# Patient Record
Sex: Male | Born: 1985
Health system: Southern US, Community
[De-identification: ages and names within clinical notes are randomized; demographics above are authoritative.]

## PROBLEM LIST (undated history)

## (undated) DIAGNOSIS — Z87442 Personal history of urinary calculi: Secondary | ICD-10-CM

## (undated) HISTORY — DX: Personal history of urinary calculi: Z87.442

---
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
## 2006-06-25 ENCOUNTER — Emergency Department (HOSPITAL_COMMUNITY): Admission: EM | Admit: 2006-06-25 | Discharge: 2006-06-25 | Payer: Self-pay | Admitting: Family Medicine

## 2008-08-11 ENCOUNTER — Emergency Department (HOSPITAL_COMMUNITY): Admission: EM | Admit: 2008-08-11 | Discharge: 2008-08-11 | Payer: Self-pay | Admitting: Emergency Medicine

## 2008-09-04 ENCOUNTER — Ambulatory Visit (HOSPITAL_COMMUNITY): Admission: RE | Admit: 2008-09-04 | Discharge: 2008-09-04 | Payer: Self-pay | Admitting: Urology

## 2008-12-04 ENCOUNTER — Encounter: Payer: Self-pay | Admitting: Family Medicine

## 2008-12-04 LAB — CONVERTED CEMR LAB
ALT: 46 units/L
AST: 25 units/L
Albumin: 4.8 g/dL
Alkaline Phosphatase: 67 units/L
CO2: 22 meq/L
Calcium: 9.8 mg/dL
Chloride: 105 meq/L
Cholesterol: 127 mg/dL
Creatinine, Ser: 0.82 mg/dL
HCT: 46.6 %
HDL: 35 mg/dL
Hemoglobin: 15.8 g/dL
LDL Cholesterol: 61 mg/dL
MCV: 82.7 fL
Platelets: 108 10*3/uL
Potassium: 4.4 meq/L
RDW: 14.9 %
Sodium: 140 meq/L
Total Bilirubin: 0.7 mg/dL
Total Protein: 7.4 g/dL
Triglycerides: 157 mg/dL
VLDL: 31 mg/dL
WBC: 8.4 10*3/uL

## 2009-01-25 ENCOUNTER — Encounter: Payer: Self-pay | Admitting: Family Medicine

## 2009-01-25 ENCOUNTER — Ambulatory Visit: Payer: Self-pay | Admitting: Family Medicine

## 2009-01-25 DIAGNOSIS — M545 Low back pain, unspecified: Secondary | ICD-10-CM | POA: Insufficient documentation

## 2009-01-25 DIAGNOSIS — Z87442 Personal history of urinary calculi: Secondary | ICD-10-CM | POA: Insufficient documentation

## 2009-01-25 DIAGNOSIS — E669 Obesity, unspecified: Secondary | ICD-10-CM | POA: Insufficient documentation

## 2009-01-25 HISTORY — DX: Obesity, unspecified: E66.9

## 2009-01-25 HISTORY — DX: Personal history of urinary calculi: Z87.442

## 2009-01-29 ENCOUNTER — Encounter: Payer: Self-pay | Admitting: Family Medicine

## 2009-02-05 ENCOUNTER — Encounter: Payer: Self-pay | Admitting: Family Medicine

## 2009-02-06 ENCOUNTER — Ambulatory Visit: Payer: Self-pay | Admitting: Family Medicine

## 2009-02-06 ENCOUNTER — Telehealth (INDEPENDENT_AMBULATORY_CARE_PROVIDER_SITE_OTHER): Payer: Self-pay | Admitting: *Deleted

## 2009-02-06 DIAGNOSIS — M79609 Pain in unspecified limb: Secondary | ICD-10-CM | POA: Insufficient documentation

## 2009-06-11 ENCOUNTER — Telehealth: Payer: Self-pay | Admitting: Family Medicine

## 2009-06-11 ENCOUNTER — Ambulatory Visit: Payer: Self-pay | Admitting: Family Medicine

## 2009-06-11 DIAGNOSIS — H9209 Otalgia, unspecified ear: Secondary | ICD-10-CM | POA: Insufficient documentation

## 2009-06-11 DIAGNOSIS — J301 Allergic rhinitis due to pollen: Secondary | ICD-10-CM | POA: Insufficient documentation

## 2009-06-11 DIAGNOSIS — J029 Acute pharyngitis, unspecified: Secondary | ICD-10-CM | POA: Insufficient documentation

## 2009-06-11 HISTORY — DX: Allergic rhinitis due to pollen: J30.1

## 2009-06-11 LAB — CONVERTED CEMR LAB: Rapid Strep: POSITIVE

## 2010-04-15 ENCOUNTER — Ambulatory Visit
Admission: RE | Admit: 2010-04-15 | Discharge: 2010-04-15 | Payer: Self-pay | Source: Home / Self Care | Attending: Family Medicine | Admitting: Family Medicine

## 2010-04-16 NOTE — Miscellaneous (Signed)
Summary: ROI  ROI   Imported By: Clydell Hakim 01/25/2009 14:00:42  _____________________________________________________________________  External Attachment:    Type:   Image     Comment:   External Document

## 2010-04-16 NOTE — Assessment & Plan Note (Signed)
Summary: ear pain/[name]/carew   Vital Signs:  Patient profile:   25 year old male Weight:      238.5 pounds Temp:     98.2 degrees F oral Pulse rate:   61 / minute Pulse rhythm:   regular BP sitting:   124 / 81  (left arm) Cuff size:   large  Vitals Entered By: Loralee Pacas CMA (June 11, 2009 11:29 AM) Comments Steven Holt    History of Present Illness: 1. Steven ear pain Reports pain in the left ear starting this am. Hearing is okay. No dripping from the ear. Pain seems to "connect to the throat." Felt the pain with drinking coffee this am. Had the flu a few weeks ago and what might have been a sinus infection a few weeks ago. Has had runny nose on and off since the flu.  fevers:   no  chills: no    nausea: no    vomiting: no    diarrhea: no     chest pain: no    shortness of breath: no     Has had ear pain intermittently a few weeks ago (not sure which ear).   2. ? allergies Pt mentions at this time of year he has runny nose, stuffy head and cough. Would like something to help with this.   Current Medications (verified): 1)  None  Allergies (verified): 1)  ! Penicillin  Review of Systems       review of systems as noted in HPI section   Physical Exam  General:  Vital signs reviewed -- obese but otherwise normal Alert, appropriate; well-dressed and well-nourished  Ears:  Steven canal mildly irritated, TM normal; R TM and canal normal.  Nose:  clear without rhinorrhea; no deformity  Mouth:  tonsils 3+ bilaterally, mucosa mildly inflamed with mild exudate.  Neck:  normal ROM, no stiffness; no lymphadenopathy  Lungs:  work of breathing unlabored, clear to auscultation bilaterally; no wheezes, rales, or ronchi; good air movement throughout  Heart:  regular rate and rhythm, no murmurs; normal s1/s2  Skin:  warm, good turgor; no rashes or lesions. brisk cap refill    Impression & Recommendations:  Problem # 1:  EAR PAIN, LEFT (ICD-388.70) Assessment  New rapid strep positive. PCN allergic so will treat with azithro x 5 days. supportive care otherwise. Return parameters discussed.  Patient agreeable. See instructions  Orders: [facility]- Est Level  3 (54098)  His updated medication list for this problem includes:    Antipyrine-benzocaine 5.4-1.4 % Soln (Benzocaine-antipyrine) .Marland KitchenMarland KitchenMarland KitchenMarland Kitchen 4 drops in affected ear three times a day as needed    Azithromycin 500 Mg Tabs (Azithromycin) ..... One by mouth daily for 5 days  Problem # 2:  ALLERGIC RHINITIS, SEASONAL (ICD-477.0) Assessment: New  clarinex d for symptomatic treatment.   Orders: [facility]- Est Level  3 (11914)  Complete Medication List: 1)  Antipyrine-benzocaine 5.4-1.4 % Soln (Benzocaine-antipyrine) .... 4 drops in affected ear three times a day as needed 2)  Clarinex-d 24 Hour 5-240 Mg Xr24h-tab (Desloratadine-pseudoephedrine) .... One by mouth daily for allergies 3)  Azithromycin 500 Mg Tabs (Azithromycin) .... One by mouth daily for 5 days  Other Orders: Rapid Strep-[facility] (78295)  Patient Instructions: 1)  take the antibiotic once a day for 5 days. 2)  wash your hands, but don't worry about spreading germs as long as you do this. 3)  take ibuprofen 800 mg three times a day as needed 4)  use the  ear drops as needed.  5)  take the clarinex for you allergies. Prescriptions: AZITHROMYCIN 500 MG TABS (AZITHROMYCIN) one by mouth daily for 5 days  #5 x 0   Entered and Authorized by:   Myrtie Soman  MD   Signed by:   Myrtie Soman  MD on 06/11/2009   Method used:   Faxed to ...       [facility] Department (retail)       [address] [place], [place]  [address]       Ph: [phone]       Fax: [phone]   RxID:   [id number] CLARINEX-D 24 HOUR 5-240 MG XR24H-TAB (DESLORATADINE-PSEUDOEPHEDRINE) one by mouth daily for allergies  #30 x 2   Entered and Authorized by:   Myrtie Soman  MD   Signed by:   Myrtie Soman  MD on 06/11/2009   Method used:   Faxed to  ...       [facility] Department (retail)       [address] [place], [place]  [address]       Ph: [phone]       Fax: [phone]   RxID:   [id number] ANTIPYRINE-BENZOCAINE 5.4-1.4 % SOLN (BENZOCAINE-ANTIPYRINE) 4 drops in affected ear three times a day as needed  #10 cc x 0   Entered and Authorized by:   Myrtie Soman  MD   Signed by:   Myrtie Soman  MD on 06/11/2009   Method used:   Faxed to ...       [facility] Department (retail)       [address] [place], [place]  [address]       Ph: [phone]       Fax: [phone]   RxID:   [id number]   Laboratory Results  Date/Time Received: June 11, 2009 11:59 AM  Date/Time Reported: June 11, 2009 12:10 PM   Other Tests  Rapid Strep: positive Comments: ...........test performed by...........Marland KitchenTerese Door, CMA

## 2010-04-16 NOTE — Assessment & Plan Note (Signed)
Summary: foot pain x 1 wk/[place]/Carew's pt   Vital Signs:  Patient profile:   25 year old male Weight:      236.9 pounds Temp:     98.6 degrees F oral Pulse rate:   60 / minute Pulse rhythm:   regular BP sitting:   102 / 69  (left arm)  Vitals Entered By: Loralee Pacas CMA (February 06, 2009 8:38 AM) CC: left foot pain Is Patient Diabetic? No   CC:  left foot pain.  History of Present Illness: 1.  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  was standing for about 3 hours doing this wearing dress shoes.  after that his left dorsal, medial foot started to hurt.  mild swelling.  has not tried any ibuprofen or other otc meds.  he has had this pain before, but never this severe.  he can bear weight on it.  no known trauma.    ROS:  no rash or skin changes.  no joint pain or redness.  Habits & Providers  Alcohol-Tobacco-Diet     Tobacco Status: never     Tobacco Counseling: not indicated; no tobacco use  Current Medications (verified): 1)  None  Allergies: 1)  ! Penicillin  Physical Exam  General:  Well-developed,well-nourished,in no acute distress; alert,appropriate and cooperative throughout examination Msk:  left foot and ankle:  normal skin; no erythema.  no swelling.   no palpable abnormality of the dorsal foot.  mild ttp between 1st and 2nd metacarpals.  normal ROM for ankle and big toe.   Additional Exam:  vital signs reviewed    Impression & Recommendations:  Problem # 1:  FOOT PAIN, LEFT (ICD-729.5) Assessment New  probably tendinitis.  not likely any fracture as he can bear weight and no known injury.  treat with RICE and naprosyn.  rtc if not better.  Orders: [facility]- Est Level  3 (24401)  Complete Medication List: 1)  Naprosyn 500 Mg Tabs (Naproxen) .Marland Kitchen.. 1 tab by mouth two times a day with food.  take scheduled for 1 week; after that, take as needed  Patient Instructions: 1)  It was nice to see you today. 2)  For your  foot, rest as much as you can.  Try to put ice on it for 10-15 minutes three times a day.  Elevate it when possible.  You can try a compression sleeve as well. 3)  Also, take the naprosyn I prescribed you.  Take it whether or not you have the pain for one week.  After that, you can take it as needed. 4)  Please schedule a follow-up appointment if you are not getting better. Prescriptions: NAPROSYN 500 MG TABS (NAPROXEN) 1 tab by mouth two times a day with food.  take scheduled for 1 week; after that, take as needed  #60 x 0   Entered and Authorized by:   Asher Muir MD   Signed by:   Asher Muir MD on 02/06/2009   Method used:   Electronically to        [facility]. [phone]* (retail)       [address]       [place], [place]  [address]       Ph: [phone]       Fax: [phone]   RxID:   [id number]

## 2010-04-16 NOTE — Progress Notes (Signed)
Summary: triage  Phone Note Call from Patient Call back at Home Phone [phone]   Caller: Patient Summary of Call: having ear pain and wants to come in today Initial call taken by: De Nurse,  June 11, 2009 10:44 AM  Follow-up for Phone Call        wanted appt tomorrow am. will see Dr. Claudius Sis. unable to come now as car is in the shop. advised tylenol or ibuprofen Follow-up by: Golden Circle RN,  June 11, 2009 10:49 AM  Additional Follow-up for Phone Call Additional follow up Details #1::        he called to say his car is ready & he can come within the next 15 minutes. changed appt to workin at 11 Additional Follow-up by: Golden Circle RN,  June 11, 2009 10:59 AM

## 2010-04-16 NOTE — Assessment & Plan Note (Signed)
Summary: np,df   Vital Signs:  Patient profile:   25 year old male Height:      69 inches Weight:      234.6 pounds BMI:     34.77 Temp:     98.9 degrees F oral Pulse rate:   76 / minute BP sitting:   98 / 64  (left arm) Cuff size:   large  Vitals Entered By: Garen Grams LPN (January 25, 2009 8:59 AM) CC: New Patient Is Patient Diabetic? No Pain Assessment Patient in pain? no        CC:  New Patient.  History of Present Illness: 1) Low back pain: x 2 months. Intermittent, every 2-3 days. Mild to moderate soreness and tightness - does not interfere with daily activities. Lumbar. Denies numbness, weakness, LE pain, fever, bony pain, weight loss. Has not taken medication for this. 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  2) Hx of kidney stones: Diagnosed three years ago. Lithotripsy in June 2010. Denies CVA pain, dysuria. Drinks water only since diagnosis.   3) Obesity: States that he used to weigh  ~ 280 lbs earlier this year. Has modified his diet to remove all juice and sodas (only drinks water), removed fried foods, increased fruit and vegetable intake. Weight 234 today. Rare exercise with lifting, elliptical machine maybe once a week. Denies chest pain, dyspnea with exertion. Would like to increase activity to lose more weight.   Habits & Providers  Alcohol-Tobacco-Diet     Alcohol drinks/day: 0     Tobacco Status: never  Exercise-Depression-Behavior     Does Patient Exercise: yes  Current Medications (verified): 1)  None  Allergies (verified): No Known Drug Allergies  Past History:  Past Medical History: Hx of kidney stones s/p lithotripsy in June 2010 Hernia repair 1995  Obesity   Past Surgical History: Lithotripsy in June 2010 Hernia repair 1995   Family History: Father (33) - good health  Mother (8) - good health   Social History: Denies smoking, alcohol or drug use past or present. 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 College grad [employer] and spends time with family for fun Smoking Status:  never Does Patient Exercise:  yes  Review of Systems       as per HPI o/w negative for 12 systems   Physical Exam  General:  Pleasant, obese male, NAD  Vitals reviewed  Eyes:  PERRL, EOMI, no conjuntivitis, no retinal abnormalities  Lungs:  CTAB w/o wheeze or crackles  Heart:  RRR, no murmurs Abdomen:  obese, soft, non tender  Msk:  BACK:    - Full ROM with lateral, flexion, extension and rotation - Mild pain left SI joint with FABER; negative on right  - negative SLR bilaterally - full ROM with hip internal and external rotation w/o pain - mild tender over left SI joint  Pulses:  2+ pedals  Extremities:  no edema  Neurologic:  alert & oriented X3, cranial nerves II-XII intact, strength normal in all extremities, sensation intact to light touch, and gait normal.     Impression & Recommendations:  Problem # 1:  OBESITY (ICD-278.00) Assessment New Per patient has lost 50 lbs over past year with dietary modification. Counseled regarding exercise and dietary modification for 25 minutes. Will follow one year. Will obtain labs from  [facility] done in September (patient states CMET, cholesterol was done) prior to rechecking.  Ht: 69 (01/25/2009)   Wt: 234.6 (01/25/2009)   BMI: 34.77 (01/25/2009)  Problem # 2:  RENAL CALCULUS, HX OF (ICD-V13.01) Assessment: Improved Encouraged to continue drinking ater as before. No symptoms. Will follow.   Problem # 3:  BACK PAIN, LUMBAR (ICD-724.2) Precontemplative about medication management - does not like to take pills unnecessarily, does not feel as though pain is that back. Likely SI joint pain. Advised regarding exercises to strengthen core muscles. Advised regarding proper lifting technique. Advised regarding weight loss.

## 2010-04-16 NOTE — Progress Notes (Signed)
Summary: Rx Pro  Phone Note Call from Patient Call back at Home Phone [phone]   Caller: Patient Summary of Call: pt states that his rx from this am has not been sent in yet. Initial call taken by: Clydell Hakim,  February 06, 2009 2:05 PM  Follow-up for Phone Call        rx is at pharmacy and patient notified. Follow-up by: Theresia Lo RN,  February 06, 2009 2:19 PM

## 2010-04-16 NOTE — Miscellaneous (Signed)
Summary: Labs from [facility] (12/04/08)  Clinical Lists Changes  Allergies: Added new allergy or adverse reaction of PENICILLIN Observations: Added new observation of NKA: F (01/29/2009 11:56) Added new observation of PLATELETK/UL: 108 K/uL (12/04/2008 11:56) Added new observation of RDW: 14.9 % (12/04/2008 11:56) Added new observation of MCV: 82.7 fL (12/04/2008 11:56) Added new observation of HCT: 46.6 % (12/04/2008 11:56) Added new observation of HGB: 15.8 g/dL (16/12/9602 54:09) Added new observation of WBC COUNT: 8.4 10*3/microliter (12/04/2008 11:56) Added new observation of VLDL: 31 mg/dL (81/19/1478 29:56) Added new observation of LDL: 61 mg/dL (21/30/8657 84:69) Added new observation of HDL: 35 mg/dL (62/95/2841 32:44) Added new observation of TRIGLYC TOT: 157 mg/dL (03/19/7251 66:44) Added new observation of CHOLESTEROL: 127 mg/dL (03/47/4259 56:38) Added new observation of CALCIUM: 9.8 mg/dL (75/64/3329 51:88) Added new observation of ALBUMIN: 4.8 g/dL (41/66/0630 16:01) Added new observation of PROTEIN, TOT: 7.4 g/dL (09/32/3557 32:20) Added new observation of SGPT (ALT): 46 units/L (12/04/2008 11:56) Added new observation of SGOT (AST): 25 units/L (12/04/2008 11:56) Added new observation of ALK PHOS: 67 units/L (12/04/2008 11:56) Added new observation of BILI TOTAL: 0.7 mg/dL (25/42/7062 37:62) Added new observation of CREATININE: 0.82 mg/dL (83/15/1761 60:73) Added new observation of CO2 PLSM/SER: 22 meq/L (12/04/2008 11:56) Added new observation of CL SERUM: 105 meq/L (12/04/2008 11:56) Added new observation of K SERUM: 4.4 meq/L (12/04/2008 11:56) Added new observation of NA: 140 meq/L (12/04/2008 11:56)

## 2010-04-16 NOTE — Miscellaneous (Signed)
Summary: foot pain  Clinical Lists Changes [facility] being [facility] for [facility]. told her we had openings & to have him come here now & we can see him. appt made.  he called back & said that the [facility] on [address] [facility] closer to his home. He is unwilling to come here now. told him if we were on his medicaid card, he was to come here unless we are closed. told him [facility] to [facility] after [facility]. states he has already waited an hour & did not want to wait anymore today.  Appt made for tomorrow at 8:30. states his R foot has been hurting off & on since last week. denies injury. now it is a little swollen. advised tylenol or ibuprofen. elevate leg & ice off & on. avoid salt or salty foods. he agreed with plan.Steven Holt  February 05, 2009 2:36 PM  Reviwed Dr. Lafonda Mosses clinic note. Seen for tendinitis --> treat with RICE.  Steven Rumpf  MD  February 07, 2009 8:35 AM

## 2010-04-24 NOTE — Assessment & Plan Note (Signed)
Summary: ear pain per [place]/eo   Vital Signs:  Patient profile:   25 year old male Temp:     98.7 degrees F oral Pulse rate:   81 / minute BP sitting:   115 / 74  (right arm)  Vitals Entered By: Arlyss Repress CMA, (April 15, 2010 3:30 PM) CC: check ears.   Primary Care Provider:  Bobby Rumpf  MD  CC:  check ears..  History of Present Illness: 25 yo M:  1. Ear Pain: yesterday, but somewhat better today. pain was vague - right ear with radiation to right molars. hx of molar extraction. no HA, dizziness, tinnitus, vertigo, trauma, hx of ear infections, known cavities, URI s/s, fever, ear fullness, N/V. he has been cleaning his ear with a qtip.  Current Medications (verified): 1)  Clarinex-D 24 Hour 5-240 Mg Xr24h-Tab (Desloratadine-Pseudoephedrine) .... One By Mouth Daily For Allergies  Allergies (verified): 1)  ! Penicillin PMH-FH-SH reviewed for relevance  Review of Systems      See HPI  Physical Exam  General:  Pleasant, obese male, NAD  Vitals reviewed  Head:  No TMJ click or pain. Eyes:  No corneal or conjunctival inflammation noted. EOMI. Perrla. Funduscopic exam benign, without hemorrhages, exudates or papilledema. Vision grossly normal. Ears:  R ear normal and L ear normal.  No tragus tenderness.  Nose:  nasal discharge, mucosal pallor.   Mouth:  Oral mucosa and oropharynx without lesions or exudates.  Teeth in good repair. Neck:  No deformities, masses, or tenderness noted.   Impression & Recommendations:  Problem # 1:  EAR PAIN, RIGHT (ICD-388.70) Assessment New Normal exam. Patient seemed reassured and stated that he thought that it was getting better. Since he is due for dental exam, recommended it to make sure that the pain is not coming from his tooth.  The following medications were removed from the medication list:    Antipyrine-benzocaine 5.4-1.4 % Soln (Benzocaine-antipyrine) .Marland KitchenMarland KitchenMarland KitchenMarland Kitchen 4 drops in affected ear three times a day as needed  Azithromycin 500 Mg Tabs (Azithromycin) ..... One by mouth daily for 5 days  Orders: [facility]- Est Level  3 ([address]109)  Complete Medication List: 1)  Clarinex-d 24 Hour 5-240 Mg Xr24h-tab (Desloratadine-pseudoephedrine) .... One by mouth daily for allergies   Orders Added: 1)  [facility]- Est Level  3 [60454]

## 2010-06-25 LAB — CBC
HCT: 46 % (ref 39.0–52.0)
Hemoglobin: 15.8 g/dL (ref 13.0–17.0)
MCHC: 34.3 g/dL (ref 30.0–36.0)
MCV: 81.9 fL (ref 78.0–100.0)
Platelets: 257 10*3/uL (ref 150–400)
RBC: 5.61 MIL/uL (ref 4.22–5.81)
RDW: 13.9 % (ref 11.5–15.5)
WBC: 9.8 10*3/uL (ref 4.0–10.5)

## 2010-06-25 LAB — DIFFERENTIAL
Basophils Absolute: 0.1 10*3/uL (ref 0.0–0.1)
Basophils Relative: 1 % (ref 0–1)
Eosinophils Absolute: 0.3 10*3/uL (ref 0.0–0.7)
Eosinophils Relative: 3 % (ref 0–5)
Lymphocytes Relative: 21 % (ref 12–46)
Lymphs Abs: 2.1 10*3/uL (ref 0.7–4.0)
Monocytes Absolute: 1.1 10*3/uL — ABNORMAL HIGH (ref 0.1–1.0)
Monocytes Relative: 11 % (ref 3–12)
Neutro Abs: 6.2 10*3/uL (ref 1.7–7.7)
Neutrophils Relative %: 64 % (ref 43–77)

## 2010-06-25 LAB — URINALYSIS, ROUTINE W REFLEX MICROSCOPIC
Bilirubin Urine: NEGATIVE
Glucose, UA: NEGATIVE mg/dL
Ketones, ur: NEGATIVE mg/dL
Nitrite: NEGATIVE
Protein, ur: 30 mg/dL — AB
Specific Gravity, Urine: 1.022 (ref 1.005–1.030)
Urobilinogen, UA: 1 mg/dL (ref 0.0–1.0)
pH: 6.5 (ref 5.0–8.0)

## 2010-06-25 LAB — URINE MICROSCOPIC-ADD ON

## 2010-06-25 LAB — BASIC METABOLIC PANEL
BUN: 8 mg/dL (ref 6–23)
CO2: 22 mEq/L (ref 19–32)
Calcium: 9.2 mg/dL (ref 8.4–10.5)
Chloride: 104 mEq/L (ref 96–112)
Creatinine, Ser: 0.71 mg/dL (ref 0.4–1.5)
GFR calc Af Amer: >60 mL/min (ref >60)
GFR calc non Af Amer: >60 mL/min (ref >60)
Glucose, Bld: 107 mg/dL — ABNORMAL HIGH (ref 70–99)
Potassium: 3.9 mEq/L (ref 3.5–5.1)
Sodium: 134 mEq/L — ABNORMAL LOW (ref 135–145)

## 2010-08-15 ENCOUNTER — Encounter: Payer: Self-pay | Admitting: Sports Medicine

## 2010-08-15 ENCOUNTER — Ambulatory Visit (INDEPENDENT_AMBULATORY_CARE_PROVIDER_SITE_OTHER): Payer: Self-pay | Admitting: Sports Medicine

## 2010-08-15 ENCOUNTER — Ambulatory Visit (HOSPITAL_COMMUNITY)
Admission: RE | Admit: 2010-08-15 | Discharge: 2010-08-15 | Disposition: A | Discharge status: Home / Self Care | Payer: Self-pay | Source: Ambulatory Visit | Attending: Family Medicine | Admitting: Family Medicine

## 2010-08-15 VITALS — BP 122/81 | HR 73 | Temp 98.6°F | Ht 69.0 in | Wt 269.0 lb

## 2010-08-15 DIAGNOSIS — R55 Syncope and collapse: Secondary | ICD-10-CM | POA: Insufficient documentation

## 2010-08-15 DIAGNOSIS — R9431 Abnormal electrocardiogram [ECG] [EKG]: Secondary | ICD-10-CM | POA: Insufficient documentation

## 2010-08-15 LAB — POCT URINALYSIS DIPSTICK
Bilirubin, UA: NEGATIVE
Blood, UA: NEGATIVE
Glucose, UA: NEGATIVE
Ketones, UA: NEGATIVE
Leukocytes, UA: NEGATIVE
Nitrite, UA: NEGATIVE
Protein, UA: NEGATIVE
Spec Grav, UA: 1.015
Urobilinogen, UA: 0.2
pH, UA: 7.5

## 2010-08-15 LAB — COMPREHENSIVE METABOLIC PANEL
ALT: 44 U/L (ref 0–53)
AST: 23 U/L (ref 0–37)
Albumin: 4.7 g/dL (ref 3.5–5.2)
Alkaline Phosphatase: 58 U/L (ref 39–117)
BUN: 12 mg/dL (ref 6–23)
CO2: 24 mEq/L (ref 19–32)
Calcium: 10.2 mg/dL (ref 8.4–10.5)
Chloride: 107 mEq/L (ref 96–112)
Creat: 0.79 mg/dL (ref 0.40–1.50)
Glucose, Bld: 104 mg/dL — ABNORMAL HIGH (ref 70–99)
Potassium: 4.6 mEq/L (ref 3.5–5.3)
Sodium: 141 mEq/L (ref 135–145)
Total Bilirubin: 0.4 mg/dL (ref 0.3–1.2)
Total Protein: 7.4 g/dL (ref 6.0–8.3)

## 2010-08-15 LAB — CBC
HCT: 46.2 % (ref 39.0–52.0)
Hemoglobin: 16.1 g/dL (ref 13.0–17.0)
MCH: 28.6 pg (ref 26.0–34.0)
MCHC: 34.8 g/dL (ref 30.0–36.0)
MCV: 82.2 fL (ref 78.0–100.0)
Platelets: 255 10*3/uL (ref 150–400)
RBC: 5.62 MIL/uL (ref 4.22–5.81)
RDW: 13.6 % (ref 11.5–15.5)
WBC: 9.2 10*3/uL (ref 4.0–10.5)

## 2010-08-15 LAB — TSH: TSH: 1.198 u[IU]/mL (ref 0.350–4.500)

## 2010-08-15 NOTE — Progress Notes (Addendum)
  Subjective:    Patient ID: Steven Holt, male    DOB: 12/18/1985, 25 y.o.   MRN: [id number]  HPI Lightheadedness:  Present for 1 week now.  Gets episodes of SOB, lightheaded (presyncopal), random, not associated with activity, occasional heart racing, occurs 3-4 times/week, lasts 5-10 mins, no associated HA, walks up 3 flights of steps daily without a problem.  No sweating, nausea, diaphoresis.  No Fam Hx CAD/arrhythmia, no A,T,D, works in a desk job with [employer], drinks 2-3 cups coffee a day.    Would like to also discuss neck pain:  Amenable to do this an another visit.   Review of Systems See HPI    Objective:   Physical Exam  Constitutional: He appears well-developed and well-nourished. No distress.       Obese  Neck: No thyromegaly present.  Cardiovascular: Normal rate, regular rhythm and normal heart sounds.  Exam reveals no gallop and no friction rub.   No murmur heard. Pulmonary/Chest: Effort normal. No respiratory distress. He has no wheezes. He has no rales. He exhibits no tenderness.  Abdominal: Soft. He exhibits no distension and no mass. There is no tenderness. There is no rebound and no guarding.  Musculoskeletal: He exhibits no edema.  Skin: Skin is warm and dry.    ECG unremarkable, early repol.    Assessment & Plan:

## 2010-08-15 NOTE — Patient Instructions (Signed)
Great to meet you, Checking some bloodwork. Don't forget to do the Holter Monitor. Come back to see me after the holter is done and we will go over symptoms and results.  Steven Holt. Benjamin Stain, M.D.

## 2010-08-15 NOTE — Assessment & Plan Note (Addendum)
Unclear etiology. No orthostatic symptoms With heart flutter will check ECG, 24h holter CBC, CMET, TSH, UA. Hydration. RTC after holter to discuss symptoms/results.

## 2010-08-16 ENCOUNTER — Encounter: Payer: Self-pay | Admitting: Sports Medicine

## 2010-08-19 ENCOUNTER — Ambulatory Visit (INDEPENDENT_AMBULATORY_CARE_PROVIDER_SITE_OTHER): Payer: Self-pay | Admitting: *Deleted

## 2010-08-19 DIAGNOSIS — R55 Syncope and collapse: Secondary | ICD-10-CM

## 2010-08-19 NOTE — Progress Notes (Signed)
In office for Holter Monitor placement. Applied and he will return tomorrow for appointment to follow up with Dr. Benjamin Stain.

## 2010-08-20 ENCOUNTER — Ambulatory Visit (INDEPENDENT_AMBULATORY_CARE_PROVIDER_SITE_OTHER): Payer: Self-pay | Admitting: Sports Medicine

## 2010-08-20 ENCOUNTER — Encounter: Payer: Self-pay | Admitting: Sports Medicine

## 2010-08-20 VITALS — BP 134/86 | HR 67 | Wt 269.3 lb

## 2010-08-20 DIAGNOSIS — M5412 Radiculopathy, cervical region: Secondary | ICD-10-CM | POA: Insufficient documentation

## 2010-08-20 DIAGNOSIS — R55 Syncope and collapse: Secondary | ICD-10-CM

## 2010-08-20 DIAGNOSIS — R51 Headache: Secondary | ICD-10-CM

## 2010-08-20 MED ORDER — KETOROLAC TROMETHAMINE 30 MG/ML IJ SOLN
30.0000 mg | Freq: Once | INTRAMUSCULAR | Status: AC
  Administered 2010-08-20: 30 mg via INTRAMUSCULAR

## 2010-08-20 MED ORDER — MELOXICAM 15 MG PO TABS: 15.0000 mg | ORAL_TABLET | Freq: Every day | ORAL | Status: AC

## 2010-08-20 NOTE — Patient Instructions (Signed)
Great to see you, See below for instructions. See rehab exercises. Get xrays at [facility]. Meloxicam for pain. Come back to see [place] as needed. Will let you know results of xrays and holter monitor.  Ihor Austin. Benjamin Stain, M.D.   Cervical Radiculopathy Cervical radiculopathy is a pinched nerve in the neck. When this happens you may have pain or numbness shooting from your neck all the way down into your arm and fingers. There are many causes of this problem. Sometimes this may happen from an injury or simply from muscle tightness in the neck from overuse. It may also happen from arthritis or boney problems. If there is no improvement after treatment, further studies may be done to find the exact cause. DIAGNOSIS X-rays may be needed if the problems become long standing. Electromyograms may be done. This study is one in which the working of nerves and muscles is studied. HOME CARE INSTRUCTIONS  Applications of ice packs may be helpful. Ice can be used in a plastic bag with a towel around it to prevent frostbite to skin. This may be used every 2 hours for 20 to 30 minutes, or as needed, while awake or as directed by your caregiver.   Sleep at night with a flat pillow.   Only take over-the-counter or prescription medicines for pain, discomfort, or fever as directed by your caregiver.   If physical therapy was prescribed, follow your caregiver's directions. If a cervical collar or cervical traction device was prescribed, use them as directed.  SEEK IMMEDIATE MEDICAL CARE IF:  You have pain not controlled with medications.   You seem to be getting worse rather than better.   You develop weakness in your hand or arm.   You develop new numbness or loss of feeling in your arms or legs.   You develop loss of bowel or bladder control.   You have difficulty with walking or balance, or develop clumsiness in the use of your arms or legs.  MAKE SURE YOU:    Understand these  instructions.   Will watch your condition.   Will get help right away if you are not doing well or get worse.  Document Released: 11/26/2000 Document Re-Released: 06/19/2008 [facility] Patient Information 2011 [place], [place].

## 2010-08-20 NOTE — Assessment & Plan Note (Signed)
Will await holter results. He will let me know if he develops any further symptoms.  Would need an event monitor if this recurs, this would entail referral to cardiology.

## 2010-08-20 NOTE — Progress Notes (Signed)
  Subjective:    Patient ID: Steven Holt, male    DOB: 04/16/1985, 25 y.o.   MRN: [id number]  HPI Palpitations:  Wore holter for 24h, didn't have any episodes.  He thinks it was just a virus and not too worried about it.  Holter results not available.  Neck pain:  Present for months, feels stiff, lasts a few mins when he gets the sensation.  No injuries.  No radicular symptoms.  No stool/urination changes.  Present mostly paraspinal from low cervical to occiput.  Doesn't use computer much during the day.  Sleeps with pillow under neck, supportive.  Feels like he has to stretch and bend neck all day to stay comfortable.  No fevers/chills.  Sometimes he pain goes into the back of his head.  Under a lot of stress.   Review of Systems    See HPI Objective:   Physical Exam  Constitutional: He appears well-developed and well-nourished. No distress.  Cardiovascular: Normal rate, regular rhythm and normal heart sounds.  Exam reveals no gallop and no friction rub.   No murmur heard. Pulmonary/Chest: Effort normal and breath sounds normal. No respiratory distress. He has no wheezes. He has no rales. He exhibits no tenderness.  Musculoskeletal:       Neck: Inspection unremarkable. No palpable stepoffs. POSITIVE Spurling's maneuver with L C5 radicular symptoms. Full neck range of motion Grip strength and sensation normal in bilateral hands Strength good C4 to T1 distribution No sensory change to C4 to T1 Reflexes normal    Skin: Skin is warm and dry.          Assessment & Plan:

## 2010-08-20 NOTE — Assessment & Plan Note (Signed)
Meloxicam. Rehab exercises. C-spine XR complete.

## 2011-07-07 ENCOUNTER — Encounter: Payer: Self-pay | Admitting: Family Medicine

## 2011-07-07 ENCOUNTER — Ambulatory Visit (INDEPENDENT_AMBULATORY_CARE_PROVIDER_SITE_OTHER): Payer: Self-pay | Admitting: Family Medicine

## 2011-07-07 VITALS — BP 114/70 | HR 72 | Temp 98.5°F | Ht 70.0 in | Wt 265.0 lb

## 2011-07-07 DIAGNOSIS — R42 Dizziness and giddiness: Secondary | ICD-10-CM

## 2011-07-07 DIAGNOSIS — R21 Rash and other nonspecific skin eruption: Secondary | ICD-10-CM

## 2011-07-07 HISTORY — DX: Dizziness and giddiness: R42

## 2011-07-07 MED ORDER — TRIAMCINOLONE ACETONIDE 0.1 % EX CREA: TOPICAL_CREAM | Freq: Two times a day (BID) | CUTANEOUS | Status: DC

## 2011-07-07 NOTE — Assessment & Plan Note (Signed)
Given distribution most consistent with contact.  Treat with steroid cream.

## 2011-07-07 NOTE — Progress Notes (Signed)
  Subjective:    Patient ID: Steven Holt, male    DOB: Nov 28, 1985, 26 y.o.   MRN: [id number]  HPI  Dizzyness Episodes of spinning sensation when holds head still for prolonged periods like when driving.  For last 6 weeks or so.  Ears and head feels full.  Allergies.  No weakness or visual changes or syncope.     Rash For about a week.  On forearms itchy red bumps.  No where else.  No oral sores or fever or fever  Review of Symptoms - see HPI  PMH - Smoking status noted.    Review of Systems     Objective:   Physical Exam no apparent distress Heart - Regular rate and rhythm.  No murmurs, gallops or rubs.    Lungs:  Normal respiratory effort, chest expands symmetrically. Lungs are clear to auscultation, no crackles or wheezes. Eye - Pupils Equal Round Reactive to light, Extraocular movements intact, Fundi without hemorrhage or visible lesions, Conjunctiva without redness or discharge Ears:  External ear exam shows no significant lesions or deformities.  Otoscopic examination reveals clear canals, tympanic membranes are intact bilaterally with dullness bilateraally. Hearing is grossly normal bilaterall Neurologic exam : Cn 2-7 intact Strength equal & normal in upper & lower extremities Able to walk on heels and toes.   Balance normal  Romberg normal, finger to nose  Skin - red papular rash on forearms only.  Mouth is normal        Assessment & Plan:

## 2011-07-07 NOTE — Assessment & Plan Note (Signed)
Most consistent with BPPV.   No CNS red flags.  Treat with head movements and follow for resolution

## 2011-07-07 NOTE — Patient Instructions (Signed)
The rash is likely an exposure to something.  Be aware of what this might be.  Use the cream twice daily for one week.   If the rash is worsening or not gone in 1-2 weeks call us  You have Benign Paroxysmal Positional Vertigo - move your head frequently to prevent the dizzyness.  Use Loratadine (clariten) 10 mg once a day to help with the allergies.   If you are not better in several weeks or if it gets worse or you have any weakness or vision changes come back

## 2012-06-25 ENCOUNTER — Other Ambulatory Visit: Payer: Self-pay

## 2012-06-25 ENCOUNTER — Encounter: Payer: Self-pay | Admitting: Family Medicine

## 2012-06-25 ENCOUNTER — Ambulatory Visit (INDEPENDENT_AMBULATORY_CARE_PROVIDER_SITE_OTHER): Payer: Self-pay | Admitting: Family Medicine

## 2012-06-25 ENCOUNTER — Other Ambulatory Visit (HOSPITAL_COMMUNITY)
Admission: RE | Admit: 2012-06-25 | Discharge: 2012-06-25 | Disposition: A | Discharge status: Home / Self Care | Payer: Self-pay | Source: Ambulatory Visit | Attending: Family Medicine | Admitting: Family Medicine

## 2012-06-25 VITALS — BP 139/80 | HR 74 | Temp 98.6°F | Ht 70.0 in | Wt 255.0 lb

## 2012-06-25 DIAGNOSIS — Z113 Encounter for screening for infections with a predominantly sexual mode of transmission: Secondary | ICD-10-CM | POA: Insufficient documentation

## 2012-06-25 DIAGNOSIS — E669 Obesity, unspecified: Secondary | ICD-10-CM

## 2012-06-25 DIAGNOSIS — J301 Allergic rhinitis due to pollen: Secondary | ICD-10-CM

## 2012-06-25 DIAGNOSIS — M1380 Other specified arthritis, unspecified site: Secondary | ICD-10-CM

## 2012-06-25 DIAGNOSIS — I00 Rheumatic fever without heart involvement: Secondary | ICD-10-CM

## 2012-06-25 LAB — POCT UA - MICROSCOPIC ONLY

## 2012-06-25 LAB — POCT URINALYSIS DIPSTICK
Bilirubin, UA: NEGATIVE
Blood, UA: NEGATIVE
Glucose, UA: NEGATIVE
Ketones, UA: NEGATIVE
Nitrite, UA: NEGATIVE
Protein, UA: NEGATIVE
Spec Grav, UA: 1.02
Urobilinogen, UA: 0.2
pH, UA: 6

## 2012-06-25 LAB — POCT SEDIMENTATION RATE: POCT SED RATE: 12 mm/hr (ref 0–22)

## 2012-06-25 MED ORDER — TRAMADOL HCL 50 MG PO TABS: 50.0000 mg | ORAL_TABLET | Freq: Three times a day (TID) | ORAL | Status: DC | PRN

## 2012-06-25 NOTE — Patient Instructions (Addendum)
I am sorry you are having joint pain that is migrating. There are many things that this could be and we are investigating those through lab tests. Unfortunately, I am out of town next week, but I will let Dr. Mikel Cella know what is going on and another doctor is covering my labs so I will let her know as well. Some tests may be back today though as well and I will take a look at them. i sent you in a medication for pain.   Thanks, Dr. Durene Cal  Health Maintenance Due  Topic Date Due  . Tetanus/tdap  08/20/2005  . Influenza Vaccine  11/15/2012

## 2012-06-26 ENCOUNTER — Encounter: Payer: Self-pay | Admitting: Family Medicine

## 2012-06-26 DIAGNOSIS — M1380 Other specified arthritis, unspecified site: Secondary | ICD-10-CM | POA: Insufficient documentation

## 2012-06-26 LAB — RHEUMATOID FACTOR: Rhuematoid fact SerPl-aCnc: <10 IU/mL (ref <=14)

## 2012-06-26 LAB — COMPREHENSIVE METABOLIC PANEL
ALT: 39 U/L (ref 0–53)
AST: 19 U/L (ref 0–37)
Albumin: 4.3 g/dL (ref 3.5–5.2)
Alkaline Phosphatase: 55 U/L (ref 39–117)
BUN: 8 mg/dL (ref 6–23)
CO2: 25 mEq/L (ref 19–32)
Calcium: 9.7 mg/dL (ref 8.4–10.5)
Chloride: 106 mEq/L (ref 96–112)
Creat: 0.69 mg/dL (ref 0.50–1.35)
Glucose, Bld: 100 mg/dL — ABNORMAL HIGH (ref 70–99)
Potassium: 3.9 mEq/L (ref 3.5–5.3)
Sodium: 138 mEq/L (ref 135–145)
Total Bilirubin: 0.4 mg/dL (ref 0.3–1.2)
Total Protein: 7.1 g/dL (ref 6.0–8.3)

## 2012-06-26 LAB — CBC WITH DIFFERENTIAL/PLATELET
Basophils Absolute: 0.1 10*3/uL (ref 0.0–0.1)
Basophils Relative: 1 % (ref 0–1)
Eosinophils Absolute: 0.3 10*3/uL (ref 0.0–0.7)
Eosinophils Relative: 3 % (ref 0–5)
HCT: 42.1 % (ref 39.0–52.0)
Hemoglobin: 14.6 g/dL (ref 13.0–17.0)
Lymphocytes Relative: 22 % (ref 12–46)
Lymphs Abs: 2 10*3/uL (ref 0.7–4.0)
MCH: 27.3 pg (ref 26.0–34.0)
MCHC: 34.7 g/dL (ref 30.0–36.0)
MCV: 78.8 fL (ref 78.0–100.0)
Monocytes Absolute: 0.6 10*3/uL (ref 0.1–1.0)
Monocytes Relative: 7 % (ref 3–12)
Neutro Abs: 6 10*3/uL (ref 1.7–7.7)
Neutrophils Relative %: 67 % (ref 43–77)
Platelets: 301 10*3/uL (ref 150–400)
RBC: 5.34 MIL/uL (ref 4.22–5.81)
RDW: 14.2 % (ref 11.5–15.5)
WBC: 9 10*3/uL (ref 4.0–10.5)

## 2012-06-26 LAB — C-REACTIVE PROTEIN: CRP: <0.5 mg/dL (ref <0.60)

## 2012-06-26 LAB — TSH: TSH: 1.748 u[IU]/mL (ref 0.350–4.500)

## 2012-06-26 NOTE — Assessment & Plan Note (Signed)
Differential includes reactive arthritis (no history recent infection and sexually active with wife only), RA, SLE (maternal history), psoriatic arthritis (scaling on knuckles an indication?), pseudogout or gout (doubt given no erythema or swelling of joints noted), endocarditis (no fever, osler nodes, splinter hemorrhages to suggest), thyroid issue  CBC with diff unremarkable, TSH wnl, CRP and ESR not elevated to indicate inflammatory disorder, cmp wnl, rheumatoid factor not elevated, UA with trace bacteria and 4-8 WBC-doubt UTI,  Pending HLA B27 (positive up to 50% cases with reactive arthritis), ANA, Cyclic Citrul Peptide Antibody, and gc/chlamydia.   I would presume this is reactive arthritis with unknown origin at this time although psoriatic arthritis possible given appearance of hands (would be atypical presentation). Rx for tramadol given for pain. WIll have patient follow up within the week to follow his pain. Precepted case with Dr. Lum Babe. Consider rheumatology referral (not discussed with Dr. Lum Babe and does not have insurance).

## 2012-06-26 NOTE — Progress Notes (Signed)
Subjective:   1. Migratory Polyarthritis-patient states starting Monday morning he noted joint in his Right ankle without any recent injury. Pain was moderate to severe and up to 8/10 at times and did not radiate. Tuesdaymorning , the pain shifted to his left ankle and later in the day to his right shoulder. Patient states the pain since that time has continued to shift from one spot to another (now also includes both wrists, right knee Left hand DIP 2-4th finger and MCP as well as right hand 3rd finger DIP. Pain severity up to 8/10 will shift from one spot to another at various times. He has tried ibuprofen with minimal relief. Continues to deny injury to any of these joints. Also denies fever/chills/nausea/vomiting/overall ill feeling/recent GI illness/polyuria/dysuria/recent sore throat/ recent infection. Sexually active with wife only and no concerns infidelity. No history of STDs. Pain is provoked the most when he actively moves joints. Passive motion without pain.   Family history of Rheumatoid arthritis in fathers sister. Thinks mother has lupus with skin only manifestations.  Personal history-No RA or other autoimmune disease known. Does have dry skin on his knuckles and told ? Eczema in past but no offical diagnosis. Never diagnosed with psoriasis.  Social history-father of 3 kids and married. Only uses PCP for acute visits typically.   ROS--See HPI  Past Medical History-obesity, allergic rhinitis, history kidney stone, history of some back pain diagnosed as cervical radiculopathy previously.  Reviewed problem list.  Medications- reviewed and updated Chief complaint-noted  Objective: BP 139/80  Pulse 74  Temp(Src) 98.6 F (37 C) (Oral)  Ht 5\' 10"  (1.778 m)  Wt 255 lb (115.667 kg)  BMI 36.59 kg/m2 Gen: NAD, comfortable at rest but appears uncomfortable when bending joints CV: RRR no murmurs rubs or gallops Lungs: CTAB no crackles, wheeze, rhonchi Skin: warm, dry, some scaling on  knuckles but without plaques.  Neuro: grossly normal, moves all extremities MSK: examined all joints metioned in note above (bilateral wrists and ankles, right knee, right shoulder, finger joints) and no joint noted to have effusion, swelling, warmth, or erythema. Patient with minimal pain with passive ROM but with moderate pain with active ROM.   Assessment/Plan:

## 2012-06-28 ENCOUNTER — Telehealth: Payer: Self-pay | Admitting: Family Medicine

## 2012-06-28 LAB — CYCLIC CITRUL PEPTIDE ANTIBODY, IGG: Cyclic Citrullin Peptide Ab: <2 U/mL (ref 0.0–5.0)

## 2012-06-28 LAB — ANA: Anti Nuclear Antibody(ANA): NEGATIVE

## 2012-06-28 NOTE — Telephone Encounter (Signed)
Message copied by Barnie Alderman on Mon Jun 28, 2012  8:59 AM ------      Message from: Shelva Majestic      Created: Sat Jun 26, 2012 11:25 PM       CBC with diff unremarkable, TSH wnl, CRP and ESR not elevated to indicate inflammatory disorder, cmp wnl, rheumatoid factor not elevated, UA with trace bacteria and 4-8 WBC-doubt UTI,       Pending HLA B27 (positive up to 50% cases with reactive arthritis), ANA, Cyclic Citrul Peptide Antibody, and gc/chlamydia.             Will ask nursing staff to inform patient that results so far have not revealed a cause. Will ask patient to follow up with [place] next week and me the week after if not improving. ------

## 2012-06-28 NOTE — Telephone Encounter (Signed)
LMOM (cell phone) that so far all labs are normal and we will be in touch when the rest of the results come in, also to sched OV to F/U.

## 2012-06-29 LAB — HLA-B27 ANTIGEN: DNA Result:: NOT DETECTED

## 2013-01-21 ENCOUNTER — Encounter: Payer: Self-pay | Admitting: Family Medicine

## 2013-01-21 ENCOUNTER — Ambulatory Visit (INDEPENDENT_AMBULATORY_CARE_PROVIDER_SITE_OTHER): Payer: No Typology Code available for payment source | Admitting: Family Medicine

## 2013-01-21 VITALS — BP 102/64 | HR 88 | Temp 99.0°F | Ht 70.0 in | Wt 293.0 lb

## 2013-01-21 DIAGNOSIS — J069 Acute upper respiratory infection, unspecified: Secondary | ICD-10-CM | POA: Insufficient documentation

## 2013-01-21 NOTE — Patient Instructions (Signed)
Steven Holt, it was nice seeing you today.  Please try some delsym for cough and if this does not work you can call back and I will prescribe you Tessalon pearls.  Also, you can use Robitussin or Mucinex for congestion and nasal saline for nasal congestion.  Continue with the tylenol or ibuprofen for comfort and if you do not start to feel better by Tuesday or Wednesday of next week, please make another appointment to be seen.  Thanks, Dr. Paulina Fusi

## 2013-01-21 NOTE — Progress Notes (Signed)
Steven Holt is a 27 y.o. male who presents today for URI Sx that have been ongoing now for 5 days.  Pt states he developed some URI Sx at that time, not around anybody sick or travel.  He took some tylenol night and cold which helped his Sx and started to feel better by 2-3 days ago before he started to develop a cough yesterday, non productive.  He denies any fevers, chills, sweats, productive cough, CP, SOB, N/V/D.    Past Medical History  Diagnosis Date  . RENAL CALCULUS, HX OF 01/25/2009    Qualifier: Diagnosis of  By: Wallene Huh  MD, Rande Lawman      History  Smoking status  . Never Smoker   Smokeless tobacco  . Not on file    No family history on file.  Current Outpatient Prescriptions on File Prior to Visit  Medication Sig Dispense Refill  . traMADol (ULTRAM) 50 MG tablet Take 1 tablet (50 mg total) by mouth every 8 (eight) hours as needed for pain.  30 tablet  0   No current facility-administered medications on file prior to visit.    ROS: Per HPI.  All other systems reviewed and are negative.   Physical Exam Filed Vitals:   01/21/13 1411  BP: 102/64  Pulse: 88  Temp: 99 F (37.2 C)    Physical Examination: General appearance - alert, well appearing, and in no distress Mental status - alert, oriented to person, place, and time Eyes - left eye normal, right eye normal Ears - bilateral TM's and external ear canals normal Nose - normal and patent, no erythema, discharge or polyps Mouth - mucous membranes moist, pharynx normal without lesions Neck - supple, no significant adenopathy Lymphatics - no palpable lymphadenopathy Chest - clear to auscultation, no wheezes, rales or rhonchi, symmetric air entry Heart - normal rate and regular rhythm, no murmurs noted

## 2013-01-21 NOTE — Assessment & Plan Note (Signed)
Pt with classic S/Sx of URI that is not concerning for PNA or influenza.  Will tx Sx at this point with delsym vs tessalon, Nasal Saline, Mucinex or Robitussin, and hydration with tylenol as needed.  F/U PRN or if no improvement but mid next week to call back for appointment.

## 2013-02-22 ENCOUNTER — Encounter: Payer: Self-pay | Admitting: Family Medicine

## 2013-02-22 ENCOUNTER — Ambulatory Visit (INDEPENDENT_AMBULATORY_CARE_PROVIDER_SITE_OTHER): Payer: BC Managed Care – PPO | Admitting: Family Medicine

## 2013-02-22 DIAGNOSIS — S86912A Strain of unspecified muscle(s) and tendon(s) at lower leg level, left leg, initial encounter: Secondary | ICD-10-CM

## 2013-02-22 DIAGNOSIS — M79609 Pain in unspecified limb: Secondary | ICD-10-CM

## 2013-02-22 DIAGNOSIS — IMO0002 Reserved for concepts with insufficient information to code with codable children: Secondary | ICD-10-CM

## 2013-02-22 NOTE — Patient Instructions (Signed)
I am not sure what caused your initial foot pain but I think the way you were walking caused a straing on the front side of your lower leg. If you develop chest pain, shortness of breath, swelling in that left leg, worsening pain, please come back and see [place] immediately or go to the hospital. We discussed that your risk for a blood clot was very low and that your legs were the same size and that you did not have any swelling in one leg.   I think you should get plugged in with Dr. Mikel Cella soon since you have your insurance back to do an annual physical.  Health Maintenance Due  Topic Date Due  . Tetanus/tdap  08/20/2005  . Influenza Vaccine  10/15/2012   Thanks, Dr. Durene Cal

## 2013-02-22 NOTE — Progress Notes (Signed)
  Tana Conch, MD Phone: [phone]  Subjective:  Chief complaint-noted  Anterior lower left leg pain Patient with pain in his left foot noted 3 days ago without unknown prompt and seemed to radiate towards his toes. This improved over last few days but then yesterday morning he developed pain on anterior portion of his left lower leg. He admits to walking on the foot differently to protect himself from the pain. He has not tried any therapies other than ibuprofen which has helped some. Worse with walking. Mild ache at rest. No radiation of pain. Located about mid portion of anterior lower leg and wraps around to medial side but not not noted in the posterior calf.  Past medical history-no history of DVT or PE ROS-No history of recent travel. No swelling in the leg. No fever/chills. No redness on the leg. Does endorse about 80 lbs weight gain since last September due to starting sedentary job.   Past Medical History Patient Active Problem List   Diagnosis Date Noted  . Migratory polyarthritis-fully resolved after another week 06/26/2012  . BPPV 07/07/2011  . ALLERGIC RHINITIS, SEASONAL 06/11/2009  . OBESITY 01/25/2009   Medications- no current medications.    Objective: BP 128/84  Pulse 87  Temp(Src) 98.8 F (37.1 C) (Oral)  Wt 298 lb (135.172 kg) Gen: NAD, resting comfortably in chair  CV: RRR no murmurs rubs or gallops, 2+ DP and PT pulses Lungs: CTAB no crackles, wheeze, rhonchi Ext: no edema, Legs equal 10cm below tibial plateau at 46.5 cm. Homan's negative. Pain with  Active foot dorsiflexion and most pronounced with eversion. Strength still 5/5 in lower extremity left and right.  Neuro: grossly normal, moves all extremities, intact distal sensation  Assessment/Plan:  Anterior lower left leg pain Patient concerned about DVT as he is not as active as he used to be and has gained a lot of weight. Wells Criteria -2 for DVT (low risk). I think patient was awkwardly walking  on his painful foot (unknown etiology which has resolved) and strained muscles of eversion and dorsiflexion ( possibly extensor digitorum longus or peroneus longus or peroneus brevis ). Advised patient relative rest, NSAIDs as needed. Return precautions given (chest pain, shortness of breath, increased leg pain or swelling)

## 2013-09-01 ENCOUNTER — Ambulatory Visit (INDEPENDENT_AMBULATORY_CARE_PROVIDER_SITE_OTHER): Payer: BC Managed Care – PPO | Admitting: Family Medicine

## 2013-09-01 ENCOUNTER — Encounter: Payer: Self-pay | Admitting: Family Medicine

## 2013-09-01 VITALS — BP 123/78 | HR 84 | Wt 294.7 lb

## 2013-09-01 DIAGNOSIS — F410 Panic disorder [episodic paroxysmal anxiety] without agoraphobia: Secondary | ICD-10-CM

## 2013-09-01 DIAGNOSIS — R42 Dizziness and giddiness: Secondary | ICD-10-CM

## 2013-09-01 HISTORY — DX: Panic disorder (episodic paroxysmal anxiety): F41.0

## 2013-09-01 LAB — CBC
HCT: 43.2 % (ref 39.0–52.0)
Hemoglobin: 15.2 g/dL (ref 13.0–17.0)
MCH: 27.7 pg (ref 26.0–34.0)
MCHC: 35.2 g/dL (ref 30.0–36.0)
MCV: 78.8 fL (ref 78.0–100.0)
Platelets: 272 10*3/uL (ref 150–400)
RBC: 5.48 MIL/uL (ref 4.22–5.81)
RDW: 14.3 % (ref 11.5–15.5)
WBC: 10.7 10*3/uL — ABNORMAL HIGH (ref 4.0–10.5)

## 2013-09-01 MED ORDER — MECLIZINE HCL 32 MG PO TABS: 32.0000 mg | ORAL_TABLET | Freq: Three times a day (TID) | ORAL | Status: DC | PRN

## 2013-09-01 NOTE — Progress Notes (Signed)
    Subjective:    Patient ID: Steven Holt is a 28 y.o. male presenting with Dizziness  on 09/01/2013  HPI: Has h/o vertigo.  Seems to be worse in the heat.  4 day h/o vertigo.  Feels like room spinning.  Takes no medication.  Recent weight gain with change in job and work-out routine. Seems to be worse since weight gain. Some loss of balance as well and symptoms of panic and heart racing and pre-syncope.  Review of Systems  Respiratory: Positive for shortness of breath.   Cardiovascular: Negative for chest pain.  Gastrointestinal: Negative for nausea and abdominal pain.  Neurological: Positive for dizziness and headaches.      Objective:    BP 123/78  Pulse 84  Wt 294 lb 11.2 oz (133.675 kg) Physical Exam  Constitutional: He is oriented to person, place, and time. He appears well-developed and well-nourished. No distress.  HENT:  Head: Normocephalic and atraumatic.  Eyes: No scleral icterus.  Neck: Neck supple.  Cardiovascular: Normal rate.   Pulmonary/Chest: Effort normal.  Abdominal: Soft.  Neurological: He is alert and oriented to person, place, and time.  Skin: Skin is dry.        Assessment & Plan:   Problem List Items Addressed This Visit     Unprioritized   Vertigo - Primary     Trial of Meclizine--blood work to ensure no underlying medical issues.    Relevant Medications      meclizine (ANTIVERT) tablet   Other Relevant Orders      CBC      TSH      Comprehensive metabolic panel   Panic attacks     Likely some panic attacks as well. Uninterested in medical treatment for this at this time.        Return in about 4 weeks (around 09/29/2013).

## 2013-09-01 NOTE — Assessment & Plan Note (Addendum)
Trial of Meclizine--blood work to ensure no underlying medical issues.

## 2013-09-01 NOTE — Patient Instructions (Signed)
Panic Attacks Panic attacks are sudden, short-livedsurges of severe anxiety, fear, or discomfort. They may occur for no reason when you are relaxed, when you are anxious, or when you are sleeping. Panic attacks may occur for a number of reasons:   Healthy people occasionally have panic attacks in extreme, life-threatening situations, such as war or natural disasters. Normal anxiety is a protective mechanism of the body that helps us react to danger (fight or flight response).  Panic attacks are often seen with anxiety disorders, such as panic disorder, social anxiety disorder, generalized anxiety disorder, and phobias. Anxiety disorders cause excessive or uncontrollable anxiety. They may interfere with your relationships or other life activities.  Panic attacks are sometimes seen with other mental illnesses, such as depression and posttraumatic stress disorder.  Certain medical conditions, prescription medicines, and drugs of abuse can cause panic attacks. SYMPTOMS  Panic attacks start suddenly, peak within 20 minutes, and are accompanied by four or more of the following symptoms:  Pounding heart or fast heart rate (palpitations).  Sweating.  Trembling or shaking.  Shortness of breath or feeling smothered.  Feeling choked.  Chest pain or discomfort.  Nausea or strange feeling in your stomach.  Dizziness, light-headedness, or feeling like you will faint.  Chills or hot flushes.  Numbness or tingling in your lips or hands and feet.  Feeling that things are not real or feeling that you are not yourself.  Fear of losing control or going crazy.  Fear of dying. Some of these symptoms can mimic serious medical conditions. For example, you may think you are having a heart attack. Although panic attacks can be very scary, they are not life threatening. DIAGNOSIS  Panic attacks are diagnosed through an assessment by your health care provider. Your health care provider will ask  questions about your symptoms, such as where and when they occurred. Your health care provider will also ask about your medical history and use of alcohol and drugs, including prescription medicines. Your health care provider may order blood tests or other studies to rule out a serious medical condition. Your health care provider may refer you to a mental health professional for further evaluation. TREATMENT   Most healthy people who have one or two panic attacks in an extreme, life-threatening situation will not require treatment.  The treatment for panic attacks associated with anxiety disorders or other mental illness typically involves counseling with a mental health professional, medicine, or a combination of both. Your health care provider will help determine what treatment is best for you.  Panic attacks due to physical illness usually go away with treatment of the illness. If prescription medicine is causing panic attacks, talk with your health care provider about stopping the medicine, decreasing the dose, or substituting another medicine.  Panic attacks due to alcohol or drug abuse go away with abstinence. Some adults need professional help in order to stop drinking or using drugs. HOME CARE INSTRUCTIONS   Take all medicines as directed by your health care provider.   Schedule and attend follow-up visits as directed by your health care provider. It is important to keep all your appointments. SEEK MEDICAL CARE IF:  You are not able to take your medicines as prescribed.  Your symptoms do not improve or get worse. SEEK IMMEDIATE MEDICAL CARE IF:   You experience panic attack symptoms that are different than your usual symptoms.  You have serious thoughts about hurting yourself or others.  You are taking medicine for panic attacks and   have a serious side effect. MAKE SURE YOU:  Understand these instructions.  Will watch your condition.  Will get help right away if you are not  doing well or get worse. Document Released: 03/03/2005 Document Revised: 03/08/2013 Document Reviewed: 10/15/2012 [facility] Patient Information 2015 [place], [place]. This information is not intended to replace advice given to you by your health care provider. Make sure you discuss any questions you have with your health care provider. Vertigo Vertigo means you feel like you or your surroundings are moving when they are not. Vertigo can be dangerous if it occurs when you are at work, driving, or performing difficult activities.  CAUSES  Vertigo occurs when there is a conflict of signals sent to your brain from the visual and sensory systems in your body. There are many different causes of vertigo, including:  Infections, especially in the inner ear.  A bad reaction to a drug or misuse of alcohol and medicines.  Withdrawal from drugs or alcohol.  Rapidly changing positions, such as lying down or rolling over in bed.  A migraine headache.  Decreased blood flow to the brain.  Increased pressure in the brain from a head injury, infection, tumor, or bleeding. SYMPTOMS  You may feel as though the world is spinning around or you are falling to the ground. Because your balance is upset, vertigo can cause nausea and vomiting. You may have involuntary eye movements (nystagmus). DIAGNOSIS  Vertigo is usually diagnosed by physical exam. If the cause of your vertigo is unknown, your caregiver may perform imaging tests, such as an MRI scan (magnetic resonance imaging). TREATMENT  Most cases of vertigo resolve on their own, without treatment. Depending on the cause, your caregiver may prescribe certain medicines. If your vertigo is related to body position issues, your caregiver may recommend movements or procedures to correct the problem. In rare cases, if your vertigo is caused by certain inner ear problems, you may need surgery. HOME CARE INSTRUCTIONS   Follow your caregiver's instructions.  Avoid  driving.  Avoid operating heavy machinery.  Avoid performing any tasks that would be dangerous to you or others during a vertigo episode.  Tell your caregiver if you notice that certain medicines seem to be causing your vertigo. Some of the medicines used to treat vertigo episodes can actually make them worse in some people. SEEK IMMEDIATE MEDICAL CARE IF:   Your medicines do not relieve your vertigo or are making it worse.  You develop problems with talking, walking, weakness, or using your arms, hands, or legs.  You develop severe headaches.  Your nausea or vomiting continues or gets worse.  You develop visual changes.  A family member notices behavioral changes.  Your condition gets worse. MAKE SURE YOU:  Understand these instructions.  Will watch your condition.  Will get help right away if you are not doing well or get worse. Document Released: 12/11/2004 Document Revised: 05/26/2011 Document Reviewed: 09/19/2010 [facility]ExitCare Patient Information 2015 [place] VillageExitCare, [place]LL[place]. This information is not intended to replace advice given to you by your health care provider. Make sure you discuss any questions you have with your health care provider.

## 2013-09-01 NOTE — Assessment & Plan Note (Signed)
Likely some panic attacks as well. Uninterested in medical treatment for this at this time.

## 2013-09-02 ENCOUNTER — Encounter: Payer: Self-pay | Admitting: Family Medicine

## 2013-09-02 ENCOUNTER — Telehealth: Payer: Self-pay | Admitting: *Deleted

## 2013-09-02 LAB — COMPREHENSIVE METABOLIC PANEL
ALT: 33 U/L (ref 0–53)
AST: 21 U/L (ref 0–37)
Albumin: 4.5 g/dL (ref 3.5–5.2)
Alkaline Phosphatase: 62 U/L (ref 39–117)
BUN: 10 mg/dL (ref 6–23)
CO2: 23 mEq/L (ref 19–32)
Calcium: 9.7 mg/dL (ref 8.4–10.5)
Chloride: 106 mEq/L (ref 96–112)
Creat: 0.71 mg/dL (ref 0.50–1.35)
Glucose, Bld: 77 mg/dL (ref 70–99)
Potassium: 4.3 mEq/L (ref 3.5–5.3)
Sodium: 141 mEq/L (ref 135–145)
Total Bilirubin: 0.5 mg/dL (ref 0.2–1.2)
Total Protein: 7.3 g/dL (ref 6.0–8.3)

## 2013-09-02 LAB — TSH: TSH: 1.426 u[IU]/mL (ref 0.350–4.500)

## 2013-09-02 NOTE — Telephone Encounter (Signed)
Received fax from [facility] stating meclizine 32 mg tablet is not available.  Send a new Rx with different strength.  Clovis PuMartin, Kabria Hetzer L, RN

## 2013-09-05 MED ORDER — MECLIZINE HCL 25 MG PO TABS: 25.0000 mg | ORAL_TABLET | Freq: Three times a day (TID) | ORAL | Status: DC | PRN

## 2013-11-23 ENCOUNTER — Encounter: Payer: Self-pay | Admitting: Family Medicine

## 2013-11-23 ENCOUNTER — Ambulatory Visit (INDEPENDENT_AMBULATORY_CARE_PROVIDER_SITE_OTHER): Payer: BC Managed Care – PPO | Admitting: Family Medicine

## 2013-11-23 VITALS — BP 128/84 | HR 78 | Temp 98.6°F | Ht 70.0 in | Wt 295.0 lb

## 2013-11-23 DIAGNOSIS — J069 Acute upper respiratory infection, unspecified: Secondary | ICD-10-CM | POA: Insufficient documentation

## 2013-11-23 MED ORDER — BENZONATATE 100 MG PO CAPS: 100.0000 mg | ORAL_CAPSULE | Freq: Two times a day (BID) | ORAL | Status: DC | PRN

## 2013-11-23 NOTE — Assessment & Plan Note (Signed)
URI making allergic rhinitis worse No red flags No signs of SBI Will tx with tessalon pearls, mucinex otc and zyrtec Pt also to cont honey, lozenges and chloraseptic spray as needed No signs of wheezing on exam to merit neb/b agonist at this time Ibuprofen prn

## 2013-11-23 NOTE — Patient Instructions (Signed)
Steven Holt it was great to see you today!  I am sorry to hear that you are not feeling well  Sounds like you have a bad virus  Please take the cough medicine to help suppress symptoms  Also take zyrtec daily for at least 1-2 week period As well as ibuprofen for antiinflammatory  You can take over the counter lozenges, chloraseptic spray for relief as well   Looking forward to seeing you soon Charlane Ferretti, MD

## 2013-11-23 NOTE — Progress Notes (Signed)
Patient ID: Steven Holt, male   DOB: 1986/01/27, 28 y.o.   MRN: [id number]   [facility] Charlane Ferretti, MD Phone: [phone]  Subjective:  Steven Holt is a 28 y.o M who presents for SOB, cough and congestion   # -Cough: Patient complains of sore throat and sweats and cough.  Symptoms began 3 days ago.  The cough is non-productive, with wheezing, with shortness of breath and is aggravated by  post nasal drip Associated symptoms include:ear and throat pain for the last 2 days. Patient does not have new pets. Patient does not have a history of asthma. Patient does have a history of environmental allergens. Patient does not have recent travel. Patient does not have a history of smoking. Patient  has previous Chest X-ray. Patient has not had a PPD done.  Child has an OM was prescribed PCN Both kids have asthma Has used Neb at home with relief. Was very concerned about wheezing heard by co-workers  All relevant systems were reviewed and were negative unless otherwise noted in the HPI  Past Medical History Reviewed problem list.  Medications- reviewed and updated Current Outpatient Prescriptions  Medication Sig Dispense Refill  . meclizine (ANTIVERT) 25 MG tablet Take 1 tablet (25 mg total) by mouth 3 (three) times daily as needed for dizziness.  30 tablet  0   No current facility-administered medications for this visit.   Chief complaint-noted No additions to family history Social history- patient is a never smoker  Objective: BP 128/84  Pulse 78  Temp(Src) 98.6 F (37 C) (Oral)  Ht  (1.778 m)  Wt 295 lb (133.811 kg)  BMI 42.33 kg/m2  SpO2 97% Gen: NAD, alert, cooperative with exam HEENT: NCAT, EOMI, PERRL, bilat inflammed nasal turbinates and ear canals; erythematous throat without exudate or abscess  Neck: FROM, supple, no palpable lymphadenopathy  CV: RRR, good S1/S2, no murmur, cap refill <3 Resp: CTABL, no wheezes, non-labored Skin: no rashes no  lesions  Assessment/Plan: See problem based a/p

## 2014-02-27 ENCOUNTER — Encounter: Payer: Self-pay | Admitting: Family Medicine

## 2014-02-27 ENCOUNTER — Ambulatory Visit (INDEPENDENT_AMBULATORY_CARE_PROVIDER_SITE_OTHER): Payer: BC Managed Care – PPO | Admitting: Family Medicine

## 2014-02-27 ENCOUNTER — Ambulatory Visit (HOSPITAL_COMMUNITY)
Admission: RE | Admit: 2014-02-27 | Discharge: 2014-02-27 | Disposition: A | Discharge status: Home / Self Care | Payer: BC Managed Care – PPO | Source: Ambulatory Visit | Attending: Family Medicine | Admitting: Family Medicine

## 2014-02-27 VITALS — BP 134/78 | HR 85 | Temp 98.4°F | Ht 70.0 in | Wt 290.2 lb

## 2014-02-27 DIAGNOSIS — R143 Flatulence: Secondary | ICD-10-CM

## 2014-02-27 DIAGNOSIS — F411 Generalized anxiety disorder: Secondary | ICD-10-CM

## 2014-02-27 DIAGNOSIS — R0789 Other chest pain: Secondary | ICD-10-CM | POA: Insufficient documentation

## 2014-02-27 DIAGNOSIS — IMO0001 Reserved for inherently not codable concepts without codable children: Secondary | ICD-10-CM

## 2014-02-27 MED ORDER — SIMETHICONE 180 MG PO CAPS: 1.0000 | ORAL_CAPSULE | Freq: Four times a day (QID) | ORAL | Status: DC | PRN

## 2014-02-27 MED ORDER — PANTOPRAZOLE SODIUM 40 MG PO TBEC: 40.0000 mg | DELAYED_RELEASE_TABLET | Freq: Every day | ORAL | Status: DC

## 2014-02-27 NOTE — Progress Notes (Signed)
Patient ID: Steven Holt, male   DOB: 06/26/1985, 28 y.o.   MRN: [id number] Subjective:   CC: Chest pain  HPI:   This is a 28 y.o. male here for chest pain. He reports intermittent anterior chest bilateral cramping/"lightning" sensation for the past 2 months that got "really bad" last week, waking him up and remaining present 5 whole days except while sleeping. Pain intermittently would sharpen. At that time, he had neck pain, left sided headache, vertigo without syncope, and sensation of dyspnea. He reports anxiety since January when he was having panic attacks. He was able to calm self enough to prevent these after he realized they were panic attacks. He has had increased work stress this year. He has never formally been diagnosed with anxiety. No h/o heart disease, no family members with early deaths from heart disease.  He does not drink sodas. Uses ibuprofen PRN (800mg  daily 3-4 days at a time when in pain, at least 2x weekly). 3 cups coffee daily throughout the day. Denies alcohol, drugs, or cigarette use.  Chest pain not related to vertigo.  Denies diarrhea, leg swelling, change in stool, emesis, epigastric pain, or other concerns. Currently chest is mildly hurting.   Sister with similar anxiety issues.   Review of Systems - Per HPI.  PMH - vertigo, pre-syncope, panic attacks, obesity, migratory polyarthritis, C5 radiculopathy, alelrgic rhinitis, acute URI Smoking status: Nonsmoker [employer] nursenetwork engineer based in [place]    Objective:  Physical Exam BP 134/78 mmHg  Pulse 85  Temp(Src) 98.4 F (36.9 C) (Oral)  Ht 5\' 10"  (1.778 m)  Wt 290 lb 3.2 oz (131.634 kg)  BMI 41.64 kg/m2 GEN: NAD CV: RRR, no m/r/g, 2+ B DP pulses PULM: CTAB, normal effort ABD: Obese, soft, nontender, nondistended PSYCH: Mood and affect anxious-appearing EXTR: No LE edema or calf tenderness  EKG: reviewed - at baseline    Assessment:     Steven HooverMichael A Zern is a 28 y.o. male here for chest pain.    Plan:      # See problem list and after visit summary for problem-specific plans.   # Health Maintenance: Declined flu shot  Follow-up: Follow up in 2 weeks for f/u of chest symptoms, anxiety, and gas pain.   Leona SingletonMaria T Analiyah Lechuga, MD [facility]

## 2014-02-27 NOTE — Patient Instructions (Signed)
Good to see you.  Your symptoms sound most likely related to acid reflux/gas and anxiety, both of which can play into each other. - Cut down on ibuprofen. Try tylenol instead for 2 weeks. - Cut down on caffeine (coffee), especially after 4pm. - Take pantoprazole daily for 2 weeks. - We are getting a baseline EKG today. - If you have chest pain with sweatiness, nausea, fainting, or other concerns, go to the emergency room. - Otherwise, come back in 2 weeks to follow up on this.  For your gas pains, - Increase fiber in your diet. You can use over the counter metamucil to help with this, daily.  For the anxiety, - staying active helps and will help with weight too. Do some exercise, including cardio, every day. - Think about a therapist. Visit psychologytoday.com for options. This is a great way to treat anxiety. - Let us discuss this more in 2 weeks. See if you have any triggers for y our anxiety in the meantime. - B e sure to stay hydrated with water (6-8 glasses daily).  Best,  Steven SingletonMaria T Kaylamarie Swickard, MD

## 2014-03-01 DIAGNOSIS — F411 Generalized anxiety disorder: Secondary | ICD-10-CM

## 2014-03-01 DIAGNOSIS — R0789 Other chest pain: Secondary | ICD-10-CM | POA: Insufficient documentation

## 2014-03-01 HISTORY — DX: Generalized anxiety disorder: F41.1

## 2014-03-01 NOTE — Assessment & Plan Note (Signed)
Chest pain is very atypical for cardiovascular source, with no relationship to exertion and normal vital signs. With hx, likely related to increased anxiety this year along with GI component with high caffeine and NSAID intake. No LE edema or respiratory decompensation, or risk factors for VTE other than obesity. - EKG done and at baseline. - If persistent, would consider referral for stress test. - Cut back on NSAIDs (switch to tylenol for now) and caffeine, and start daily PPI x 2 weeks.  - Return precautions reviewed to go to ED. Otherwise, f/u 2 weeks.

## 2014-03-01 NOTE — Assessment & Plan Note (Signed)
Gas pain likely related to constipation.  - GasX, increase dietary fiber, use supplemental fiber, stay hydrated.

## 2014-03-01 NOTE — Assessment & Plan Note (Signed)
Anxiety seems to play a role with chest pains. - Discussed staying active and weight loss. - Consider therapy. Provided [employer]psychologytoday.com. - F/u 2 weeks and in meantime, think about triggers.

## 2014-03-13 ENCOUNTER — Ambulatory Visit: Payer: BC Managed Care – PPO | Admitting: Family Medicine

## 2014-03-24 ENCOUNTER — Ambulatory Visit: Payer: BC Managed Care – PPO | Admitting: Family Medicine

## 2014-05-10 ENCOUNTER — Telehealth: Payer: Self-pay | Admitting: Family Medicine

## 2014-05-10 DIAGNOSIS — IMO0001 Reserved for inherently not codable concepts without codable children: Secondary | ICD-10-CM

## 2014-05-10 MED ORDER — SIMETHICONE 180 MG PO CAPS
1.0000 | ORAL_CAPSULE | Freq: Four times a day (QID) | ORAL | Status: DC | PRN
Start: 2014-05-10 — End: 2017-03-25

## 2014-05-10 NOTE — Telephone Encounter (Signed)
Will forward to MD for review. Kenyon Eshleman, CMA 

## 2014-05-10 NOTE — Telephone Encounter (Signed)
Wife called and said that her husband needs a refill on his Simethicone called in to [facility] on [address]. jw

## 2014-05-10 NOTE — Telephone Encounter (Signed)
Sent to patient's pharmacy.  Leona SingletonMaria T Milea Klink, MD

## 2014-05-18 ENCOUNTER — Other Ambulatory Visit: Payer: Self-pay | Admitting: Family Medicine

## 2014-05-30 ENCOUNTER — Telehealth: Payer: Self-pay | Admitting: Family Medicine

## 2014-05-30 NOTE — Telephone Encounter (Signed)
Will forward to PCP for review. Prem Coykendall, CMA. 

## 2014-05-30 NOTE — Telephone Encounter (Signed)
Pt called and needs a refill on his Protonix. I seen where we call this in in on 05/19/14 but he said it wasn't the correct medication. jw

## 2014-05-31 MED ORDER — PANTOPRAZOLE SODIUM 40 MG PO TBEC: 40.0000 mg | DELAYED_RELEASE_TABLET | Freq: Every day | ORAL | Status: DC

## 2014-05-31 NOTE — Telephone Encounter (Signed)
Please let him know that I sent in a lower dose (20mg ) to trial how he does with this (this is the usual downgrade after 2 weeks of high dose therapy).   I will send in the 40mg  dose for 1 more month and then plan to decrease to 20mg , he should take before bedtime, and if not helping we need to revisit the issue in clinic to see what else may be going on.  Leona SingletonMaria T Ramey Ketcherside, MD

## 2014-05-31 NOTE — Telephone Encounter (Signed)
Unable to reach patient will try calling again later. Chanel Mcadams,CMA

## 2014-06-02 ENCOUNTER — Encounter: Payer: Self-pay | Admitting: *Deleted

## 2014-06-02 NOTE — Telephone Encounter (Signed)
Advised pt's wife as directed below and verbalized understanding. Michall Noffke, CMA.

## 2014-09-07 ENCOUNTER — Other Ambulatory Visit: Payer: Self-pay | Admitting: Family Medicine

## 2014-09-07 MED ORDER — PANTOPRAZOLE SODIUM 20 MG PO TBEC: 20.0000 mg | DELAYED_RELEASE_TABLET | Freq: Every day | ORAL | Status: DC

## 2014-09-07 NOTE — Telephone Encounter (Signed)
Patient left a message on the medical records line that he is needing a refill of his heartburn medication.

## 2014-09-07 NOTE — Telephone Encounter (Signed)
Decreasing pantoprazole to 20mg  daily as discussed in last refill request.  Leona Singleton, MD

## 2014-10-05 ENCOUNTER — Other Ambulatory Visit: Payer: Self-pay | Admitting: Family Medicine

## 2014-12-13 ENCOUNTER — Other Ambulatory Visit: Payer: Self-pay | Admitting: Family Medicine

## 2014-12-13 DIAGNOSIS — K219 Gastro-esophageal reflux disease without esophagitis: Secondary | ICD-10-CM

## 2014-12-13 MED ORDER — PANTOPRAZOLE SODIUM 40 MG PO TBEC: 40.0000 mg | DELAYED_RELEASE_TABLET | Freq: Every day | ORAL | Status: DC

## 2015-02-12 ENCOUNTER — Telehealth: Payer: Self-pay | Admitting: Family Medicine

## 2015-02-12 DIAGNOSIS — K219 Gastro-esophageal reflux disease without esophagitis: Secondary | ICD-10-CM

## 2015-02-12 MED ORDER — PANTOPRAZOLE SODIUM 40 MG PO TBEC: 40.0000 mg | DELAYED_RELEASE_TABLET | Freq: Every day | ORAL | Status: DC

## 2015-02-12 NOTE — Telephone Encounter (Signed)
Rx sent in.  Steven Holt M. Jimmey RalphParker, MD [facility] Resident PGY-2 02/12/2015 1:41 PM

## 2015-02-12 NOTE — Telephone Encounter (Signed)
Patient is needing a rx for Protonix called in to [facility] on [address].

## 2015-02-12 NOTE — Telephone Encounter (Signed)
Will forward to PCP for review. Kahleel Fadeley, CMA. 

## 2015-02-12 NOTE — Telephone Encounter (Signed)
LM for patient to call back.  Please help him schedule an appt with Dr. Jimmey RalphParker at his convenience. Tiffanny Lamarche,CMA

## 2015-05-21 ENCOUNTER — Encounter: Payer: Self-pay | Admitting: Family Medicine

## 2015-05-21 ENCOUNTER — Ambulatory Visit (INDEPENDENT_AMBULATORY_CARE_PROVIDER_SITE_OTHER): Payer: BLUE CROSS/BLUE SHIELD | Admitting: Family Medicine

## 2015-05-21 VITALS — BP 140/86 | HR 76 | Temp 98.7°F | Ht 70.0 in | Wt 300.0 lb

## 2015-05-21 DIAGNOSIS — Z8619 Personal history of other infectious and parasitic diseases: Secondary | ICD-10-CM

## 2015-05-21 DIAGNOSIS — Z789 Other specified health status: Secondary | ICD-10-CM

## 2015-05-21 DIAGNOSIS — R350 Frequency of micturition: Secondary | ICD-10-CM

## 2015-05-21 DIAGNOSIS — Z309 Encounter for contraceptive management, unspecified: Secondary | ICD-10-CM

## 2015-05-21 DIAGNOSIS — F410 Panic disorder [episodic paroxysmal anxiety] without agoraphobia: Secondary | ICD-10-CM

## 2015-05-21 DIAGNOSIS — G479 Sleep disorder, unspecified: Secondary | ICD-10-CM

## 2015-05-21 DIAGNOSIS — Z3009 Encounter for other general counseling and advice on contraception: Secondary | ICD-10-CM

## 2015-05-21 DIAGNOSIS — Z23 Encounter for immunization: Secondary | ICD-10-CM

## 2015-05-21 DIAGNOSIS — K219 Gastro-esophageal reflux disease without esophagitis: Secondary | ICD-10-CM | POA: Diagnosis not present

## 2015-05-21 DIAGNOSIS — E669 Obesity, unspecified: Secondary | ICD-10-CM | POA: Diagnosis not present

## 2015-05-21 DIAGNOSIS — Z111 Encounter for screening for respiratory tuberculosis: Secondary | ICD-10-CM | POA: Diagnosis not present

## 2015-05-21 HISTORY — DX: Gastro-esophageal reflux disease without esophagitis: K21.9

## 2015-05-21 HISTORY — DX: Frequency of micturition: R35.0

## 2015-05-21 HISTORY — DX: Sleep disorder, unspecified: G47.9

## 2015-05-21 LAB — CBC
HCT: 43.6 % (ref 39.0–52.0)
Hemoglobin: 14.7 g/dL (ref 13.0–17.0)
MCH: 27.2 pg (ref 26.0–34.0)
MCHC: 33.7 g/dL (ref 30.0–36.0)
MCV: 80.7 fL (ref 78.0–100.0)
MPV: 10 fL (ref 8.6–12.4)
Platelets: 319 10*3/uL (ref 150–400)
RBC: 5.4 MIL/uL (ref 4.22–5.81)
RDW: 13.9 % (ref 11.5–15.5)
WBC: 11.4 10*3/uL — ABNORMAL HIGH (ref 4.0–10.5)

## 2015-05-21 LAB — COMPLETE METABOLIC PANEL WITH GFR
ALT: 59 U/L — ABNORMAL HIGH (ref 9–46)
AST: 31 U/L (ref 10–40)
Albumin: 4.3 g/dL (ref 3.6–5.1)
Alkaline Phosphatase: 55 U/L (ref 40–115)
BUN: 8 mg/dL (ref 7–25)
CO2: 25 mmol/L (ref 20–31)
Calcium: 9.7 mg/dL (ref 8.6–10.3)
Chloride: 105 mmol/L (ref 98–110)
Creat: 0.74 mg/dL (ref 0.60–1.35)
GFR, Est African American: >89 mL/min (ref >=60)
GFR, Est Non African American: >89 mL/min (ref >=60)
Glucose, Bld: 95 mg/dL (ref 65–99)
Potassium: 4.1 mmol/L (ref 3.5–5.3)
Sodium: 140 mmol/L (ref 135–146)
Total Bilirubin: 0.4 mg/dL (ref 0.2–1.2)
Total Protein: 6.9 g/dL (ref 6.1–8.1)

## 2015-05-21 LAB — LIPID PANEL
Cholesterol: 150 mg/dL (ref 125–200)
HDL: 37 mg/dL — ABNORMAL LOW (ref >=40)
LDL Cholesterol: 56 mg/dL (ref <130)
Total CHOL/HDL Ratio: 4.1 Ratio (ref <=5.0)
Triglycerides: 286 mg/dL — ABNORMAL HIGH (ref <150)
VLDL: 57 mg/dL — ABNORMAL HIGH (ref <30)

## 2015-05-21 LAB — TSH: TSH: 2.15 mIU/L (ref 0.40–4.50)

## 2015-05-21 LAB — POCT GLYCOSYLATED HEMOGLOBIN (HGB A1C): Hemoglobin A1C: 5.3

## 2015-05-21 NOTE — Assessment & Plan Note (Signed)
Symptoms of GERD, most likely related to obesity.  -  Continue PPI -  Recommend weight loss

## 2015-05-21 NOTE — Assessment & Plan Note (Signed)
Continues to have 1-2 panic attacks weekly which he is able to control without medications.  He is uninterested in medical therapy at this time.  -  He is considering cognitive behavioral therapy

## 2015-05-21 NOTE — Assessment & Plan Note (Addendum)
Most likely contributing to several of his complaints:  GERD, sleep difficulties , worsening headaches -  Recommended follow-up with PCP to discuss weight loss plans after her sleep study and blood work completed - Consider gastric bypass given BMI greater than 40 - check TSH

## 2015-05-21 NOTE — Assessment & Plan Note (Signed)
Most likely due to excessive of water consumption.  No nocturia or dysuria.  - check CMP, A1c

## 2015-05-21 NOTE — Patient Instructions (Signed)
It was great seeing you today.  Some of your symptoms are consistent with obstructive sleep apnea.  I have ordered a sleep study to evaluate for this.  I have also ordered some blood work to further evaluate your frequent urination.  I will send you a letter, if these results are normal or call if we need to discuss anything.  I have also referred you to urology for discussion of vasectomy.   1. I'm glad you are considering how your weight may be related to several of your symptoms.  I recommend following up with Dr. Jimmey RalphParker to discuss weight loss after the results of your sleep study and blood work are done.    Please bring all your medications to every doctors visit  Sign up for My Chart to have easy access to your labs results, and communication with your Primary care physician.  Next Appointment  Please call to make an appointment with Dr Jimmey RalphParker in 1-2 months for Sleep Apnea   If you have any questions or concerns before then, please call the clinic at [phone].  Take Care,   Dr Wenda LowJames Audon Heymann

## 2015-05-21 NOTE — Assessment & Plan Note (Signed)
BMI greater than 40.  Nocturnal awakenings, and gasping snoring, likely due to obstructive sleep apnea.  -  Sleep study -  Follow-up with PCP to discuss

## 2015-05-21 NOTE — Progress Notes (Signed)
  Patient name: Steven Holt MRN [id number]  Date of birth: 07/04/1985  CC & HPI:  Steven Holt is a 30 y.o. male presenting today for choking during sleep, panic attacks, frequent urination, need for immunization for work  Sleep difficulties - He reports several episodes month of waking up gasping, choking the past 6 months - Occasionally wake up in panic attack - His wife reports loud snoring - Associated with daytime fatigue - Nothing makes sleep better or worse - Associated sleep problems with his weight  Frequent urination - He reports frequent daytime urination but denies dysuria - Drinks 4-5 L of water daily, in order to "flush out his system" - History of kidney stones requiring lithotripsy; since then he has cut out sodas - Eats lots of carbs - Paternal grandfather had diabetes  Panic attacks - He reports several episodes of panic attacks a month - Episodes associated with extreme anxiety, trouble catching his breath.  Denies chest pain, presyncopal episodes - Episodes are not triggered by any particular fear  - Has been occuring for the past several years  Smoking History Noted  Objective Findings:  Vitals: BP 140/86 mmHg  Pulse 76  Temp(Src) 98.7 F (37.1 C) (Oral)  Ht 5\' 10"  (1.778 m)  Wt 300 lb (136.079 kg)  BMI 43.05 kg/m2  Gen: NAD; Obese CV: RRR w/o m/r/g, pulses +2 b/l Resp: CTAB w/ normal respiratory effort GI: No skin changes; BS + x 4 quads; No tenderness or masses, No CVA tenderness  Assessment & Plan:   GERD (gastroesophageal reflux disease)  Symptoms of GERD, most likely related to obesity.  -  Continue PPI -  Recommend weight loss  OBESITY  Most likely contributing to several of his complaints:  GERD, sleep difficulties , worsening headaches -  Recommended follow-up with PCP to discuss weight loss plans after her sleep study and blood work completed - Consider gastric bypass given BMI greater than 40 - check TSH  Panic attacks  Continues to have 1-2 panic attacks weekly which he is able to control without medications.  He is uninterested in medical therapy at this time.  -  He is considering cognitive behavioral therapy  Sleep disorder  BMI greater than 40.  Nocturnal awakenings, and gasping snoring, likely due to obstructive sleep apnea.  -  Sleep study -  Follow-up with PCP to discuss   Frequent urination  Most likely due to excessive of water consumption.  No nocturia or dysuria.  - check CMP, A1c

## 2015-05-22 ENCOUNTER — Encounter: Payer: Self-pay | Admitting: Family Medicine

## 2015-05-22 LAB — MEASLES/MUMPS/RUBELLA IMMUNITY
Mumps IgG: 224 AU/mL — ABNORMAL HIGH (ref <9.00)
Rubella: 5.24 Index — ABNORMAL HIGH (ref <0.90)
Rubeola IgG: >300 AU/mL — ABNORMAL HIGH (ref <25.00)

## 2015-05-22 LAB — VARICELLA ZOSTER ANTIBODY, IGG: Varicella IgG: 1156 Index — ABNORMAL HIGH (ref <135.00)

## 2015-05-23 ENCOUNTER — Ambulatory Visit (INDEPENDENT_AMBULATORY_CARE_PROVIDER_SITE_OTHER): Payer: BLUE CROSS/BLUE SHIELD | Admitting: *Deleted

## 2015-05-23 ENCOUNTER — Encounter: Payer: Self-pay | Admitting: *Deleted

## 2015-05-23 DIAGNOSIS — Z7689 Persons encountering health services in other specified circumstances: Secondary | ICD-10-CM

## 2015-05-23 DIAGNOSIS — Z111 Encounter for screening for respiratory tuberculosis: Secondary | ICD-10-CM

## 2015-05-23 LAB — TB SKIN TEST
Induration: 0 mm
TB Skin Test: NEGATIVE

## 2015-05-23 NOTE — Progress Notes (Signed)
   PPD Reading Note PPD read and results entered in EpicCare. Result: 0 mm induration. Interpretation: Negative If test not read within 48-72 hours of initial placement, patient advised to repeat in other arm 1-3 weeks after this test. Allergic reaction: no  Hiren Peplinski L, RN  

## 2015-07-02 DIAGNOSIS — Z0000 Encounter for general adult medical examination without abnormal findings: Secondary | ICD-10-CM | POA: Diagnosis not present

## 2015-07-02 DIAGNOSIS — Z3009 Encounter for other general counseling and advice on contraception: Secondary | ICD-10-CM | POA: Diagnosis not present

## 2015-07-13 ENCOUNTER — Ambulatory Visit (HOSPITAL_BASED_OUTPATIENT_CLINIC_OR_DEPARTMENT_OTHER): Payer: BLUE CROSS/BLUE SHIELD

## 2015-08-06 ENCOUNTER — Telehealth: Payer: Self-pay | Admitting: Family Medicine

## 2015-08-06 NOTE — Telephone Encounter (Signed)
Please mail his titer values to him.  The labs were done in march.  His wife stopped by the office to make this request.

## 2015-08-07 NOTE — Telephone Encounter (Signed)
Labs printed- will mail today. Page, cma.

## 2015-08-07 NOTE — Telephone Encounter (Signed)
Patient wife informed

## 2015-09-11 ENCOUNTER — Encounter (HOSPITAL_BASED_OUTPATIENT_CLINIC_OR_DEPARTMENT_OTHER): Payer: BLUE CROSS/BLUE SHIELD

## 2016-01-21 DIAGNOSIS — R1032 Left lower quadrant pain: Secondary | ICD-10-CM | POA: Diagnosis not present

## 2016-01-21 DIAGNOSIS — R140 Abdominal distension (gaseous): Secondary | ICD-10-CM | POA: Diagnosis not present

## 2016-01-21 DIAGNOSIS — R634 Abnormal weight loss: Secondary | ICD-10-CM | POA: Diagnosis not present

## 2016-01-21 DIAGNOSIS — K581 Irritable bowel syndrome with constipation: Secondary | ICD-10-CM | POA: Diagnosis not present

## 2016-02-05 DIAGNOSIS — K581 Irritable bowel syndrome with constipation: Secondary | ICD-10-CM | POA: Diagnosis not present

## 2017-03-24 NOTE — Progress Notes (Signed)
   [facility] Phone: [phone]   Date of Visit: 03/25/2017   HPI:  Anxiety: -Reports of symptoms for about 1 year but symptoms have been worsening in the past 6 months -He has difficulty focusing or paying attention at times.  He feels that he worries about certain things that he knows he should not be worried about but he cannot help himself. -He lost his father lost father about 2 weeks ago. -He reports that work life and family life are good -He has had issues with sleep which began about 2 weeks ago after his father's death.  It is difficult for him to go to sleep and he wakes up at night and is unable to go back to sleep.  He does feel tired and feels like he needs to sleep -He is currently out of work for 2 weeks relief -Family history of anxiety disease mother and father), history of depression on the paternal side.  He thinks his paternal aunt may have had bipolar disorder but he is unsure. -Denies symptoms of grandiosity, flight of ideas, agitation -Denies any chest pain, palpitations, shortness of breath  ROS: See HPI.  PMFSH: PMH: Allergic Rhinitis GERD Vertigo Panic Attacks Obesity   PHYSICAL EXAM: BP 126/74   Pulse 66   Temp 98.9 F (37.2 C) (Oral)   Ht 5\' 8"  (1.727 m)   Wt 261 lb (118.4 kg)   SpO2 99%   BMI 39.68 kg/m  GEN: NAD HEENT: Atraumatic, normocephalic, neck supple, EOMI, sclera clear  CV: RRR, no murmurs, rubs, or gallops PULM: CTAB, normal effort SKIN: No rash or cyanosis; warm and well-perfused PSYCH: Mood and affect euthymic, normal rate and volume of speech NEURO: Awake, alert, no focal deficits grossly, normal speech   ASSESSMENT/PLAN:  Health maintenance:  - Declines flu vaccine   GAD (generalized anxiety disorder) His symptoms are consistent with anxiety.  Gad 7 score 13, very difficult.  PHQ 9 score 10, somewhat difficult and question 9 is negative.  Does not have any symptoms concerning for bipolar  disorder.  Discussed options for management of his symptoms.  He currently declines behavioral therapy.  Is open to starting medication.  Sertraline 25 mg daily for 1 week then increase to 50 mg daily.  Discussed side effects mainly GI symptoms and possible worsening of anxiety symptoms prior to his symptoms improving.  Follow-up in 4-6 weeks.   Palma HolterKanishka G Tyrrell Stephens, MD PGY 3 [facility]

## 2017-03-25 ENCOUNTER — Encounter: Payer: Self-pay | Admitting: Internal Medicine

## 2017-03-25 ENCOUNTER — Ambulatory Visit (INDEPENDENT_AMBULATORY_CARE_PROVIDER_SITE_OTHER): Payer: BLUE CROSS/BLUE SHIELD | Admitting: Internal Medicine

## 2017-03-25 ENCOUNTER — Other Ambulatory Visit: Payer: Self-pay

## 2017-03-25 DIAGNOSIS — F411 Generalized anxiety disorder: Secondary | ICD-10-CM | POA: Diagnosis not present

## 2017-03-25 MED ORDER — SERTRALINE HCL 25 MG PO TABS: ORAL_TABLET | ORAL | 0 refills | Status: DC

## 2017-03-25 NOTE — Assessment & Plan Note (Signed)
His symptoms are consistent with anxiety.  Gad 7 score 13, very difficult.  PHQ 9 score 10, somewhat difficult and question 9 is negative.  Does not have any symptoms concerning for bipolar disorder.  Discussed options for management of his symptoms.  He currently declines behavioral therapy.  Is open to starting medication.  Sertraline 25 mg daily for 1 week then increase to 50 mg daily.  Discussed side effects mainly GI symptoms and possible worsening of anxiety symptoms prior to his symptoms improving.  Follow-up in 4-6 weeks.

## 2017-03-25 NOTE — Patient Instructions (Signed)
Thank you for coming!  We will start Sertraline. Take 1 tablet daily for 1 week then increase to 2 tablets daily.   Please follow up in 4-6 weeks (or sooner if you have concerns). We will do lab work at that time

## 2017-04-18 ENCOUNTER — Other Ambulatory Visit: Payer: Self-pay | Admitting: Internal Medicine

## 2017-05-08 ENCOUNTER — Ambulatory Visit: Payer: BLUE CROSS/BLUE SHIELD | Admitting: Internal Medicine

## 2017-05-22 ENCOUNTER — Other Ambulatory Visit: Payer: Self-pay | Admitting: Internal Medicine

## 2017-05-22 NOTE — Telephone Encounter (Signed)
PLease have patient follow up in clinic for anxiety. I have not seen him since starting Zoloft.

## 2017-06-19 ENCOUNTER — Other Ambulatory Visit: Payer: Self-pay | Admitting: Internal Medicine

## 2017-06-19 NOTE — Telephone Encounter (Signed)
Please have patient make a follow up visit

## 2017-06-22 NOTE — Telephone Encounter (Signed)
Tried calling patient but there was no answer.  Will try calling again. Shaquil Aldana,CMA

## 2017-07-31 ENCOUNTER — Telehealth: Payer: Self-pay | Admitting: Internal Medicine

## 2017-07-31 NOTE — Telephone Encounter (Signed)
Scheduled ov for 08/06/17- pt states he is out of his sertraline and would like to know if we can call him in enough to last until his appt next week. Shawna Orleans, RN

## 2017-07-31 NOTE — Telephone Encounter (Signed)
Please ask patient to make a follow up visit to see how he is doing with this medication.

## 2017-07-31 NOTE — Telephone Encounter (Signed)
Attempted to contact pt to schedule no answer, VM was left asking to call to schedule a FU apt with pcp.

## 2017-08-02 NOTE — Telephone Encounter (Signed)
I already sent a refill rx

## 2017-08-05 NOTE — Progress Notes (Deleted)
   [facility] Phone: [phone]   Date of Visit: 08/06/2017   HPI:  ***  ROS: See HPI.  PMFSH:  PMH: Obesity Allergic Rhinitis GAD Panic Attacks GERD  PHYSICAL EXAM: There were no vitals taken for this visit. Gen: *** HEENT: *** Heart: *** Lungs: *** Neuro: *** Ext: ***  ASSESSMENT/PLAN:  Health maintenance:  -***  No problem-specific Assessment & Plan notes found for this encounter.  FOLLOW UP: Follow up in *** for ***  Palma Holter, MD PGY 2 [facility]

## 2017-08-06 ENCOUNTER — Ambulatory Visit: Payer: BLUE CROSS/BLUE SHIELD | Admitting: Internal Medicine

## 2017-09-11 ENCOUNTER — Other Ambulatory Visit: Payer: Self-pay | Admitting: Internal Medicine

## 2017-09-11 NOTE — Telephone Encounter (Signed)
Appt made for 10-02-17 with new PCP. Icy Fuhrmann,CMA

## 2017-09-11 NOTE — Telephone Encounter (Signed)
Please ask him to make a follow up appointment

## 2017-10-02 ENCOUNTER — Ambulatory Visit: Payer: BLUE CROSS/BLUE SHIELD | Admitting: Family Medicine

## 2017-11-02 ENCOUNTER — Other Ambulatory Visit: Payer: Self-pay | Admitting: Internal Medicine

## 2017-12-12 ENCOUNTER — Other Ambulatory Visit: Payer: Self-pay | Admitting: Family Medicine

## 2017-12-14 NOTE — Telephone Encounter (Signed)
Changed sig to BID.  Original was 1 daily for one week then increase to BID. Sagal Gayton, Maryjo Rochester, CMA

## 2019-02-17 ENCOUNTER — Other Ambulatory Visit: Payer: Self-pay

## 2019-02-17 ENCOUNTER — Telehealth (INDEPENDENT_AMBULATORY_CARE_PROVIDER_SITE_OTHER): Payer: BC Managed Care – PPO | Admitting: Family Medicine

## 2019-02-17 DIAGNOSIS — M79661 Pain in right lower leg: Secondary | ICD-10-CM

## 2019-02-17 DIAGNOSIS — M79662 Pain in left lower leg: Secondary | ICD-10-CM

## 2019-02-17 DIAGNOSIS — U071 COVID-19: Secondary | ICD-10-CM

## 2019-02-17 HISTORY — DX: Pain in right lower leg: M79.661

## 2019-02-17 HISTORY — DX: COVID-19: U07.1

## 2019-02-17 HISTORY — DX: Pain in left lower leg: M79.662

## 2019-02-17 NOTE — Progress Notes (Signed)
[facility] Visit  Patient consented to have virtual visit. Method of visit: Video  Encounter participants: Patient: Steven Holt - located at home in [facility] Provider: Nuala Alpha - located at [facility] Others (if applicable): none  Chief Complaint: Return to working out  HPI:  Return to work out after he suspects having COVID-19. A couple of weeks ago he had loss of smell and taste with headaches. He never had shortness of breath, difficulty breathing, chest pain, cough, or fatigue. He never had a fever and checked it everyday and has been more than 72 hours without a fever or headache since the onset of his symptoms.  He has a history of several months of calf pain that bothers him intermittently while he works out. It goes away on its own and then returns during or after a workout. No recent travel, surgery, no SOB, no chest pain, no history or family history of blood clots, and he is a non-smoker.  ROS: per HPI  Pertinent PMHx: Allergic rhinitis, GERD, GAD  Exam:  Gen: NAD Respiratory: speaks in full sentences, appears to have normal work of breathing Skin: no rashes on the face  Assessment/Plan:  COVID-19 virus infection Patient and family members had symptoms concerning for COVID-19 that have now resolved. Self quarantined for more than 10 days with resolution of symptoms. No fever for over 72 hours. - Advised he is cleared to work out but start at 50% of previous workout capacity and slowly increase as symptoms allowed. Discussed symptoms such as SOB, difficulty breathing, chest pain that should cause him to stop workout and seek care.   Bilateral calf pain Ongoing for months with no risk factors making DVT very unlikely but will obtain U/S to rule out.    Time spent during visit with patient: >25 minutes  Harolyn Rutherford, DO [place], PGY-3

## 2019-02-17 NOTE — Assessment & Plan Note (Signed)
Patient and family members had symptoms concerning for COVID-19 that have now resolved. Self quarantined for more than 10 days with resolution of symptoms. No fever for over 72 hours. - Advised he is cleared to work out but start at 50% of previous workout capacity and slowly increase as symptoms allowed. Discussed symptoms such as SOB, difficulty breathing, chest pain that should cause him to stop workout and seek care.

## 2019-02-17 NOTE — Assessment & Plan Note (Signed)
Ongoing for months with no risk factors making DVT very unlikely but will obtain U/S to rule out.

## 2019-02-21 ENCOUNTER — Other Ambulatory Visit: Payer: Self-pay

## 2019-02-21 ENCOUNTER — Ambulatory Visit (HOSPITAL_COMMUNITY)
Admission: RE | Admit: 2019-02-21 | Discharge: 2019-02-21 | Disposition: A | Discharge status: Home / Self Care | Payer: BC Managed Care – PPO | Source: Ambulatory Visit | Attending: Family Medicine | Admitting: Family Medicine

## 2019-02-21 DIAGNOSIS — M79661 Pain in right lower leg: Secondary | ICD-10-CM | POA: Insufficient documentation

## 2019-02-21 DIAGNOSIS — M79662 Pain in left lower leg: Secondary | ICD-10-CM | POA: Diagnosis not present

## 2019-02-21 NOTE — Progress Notes (Signed)
LE venous duplex       has been completed. Preliminary results can be found under CV proc through chart review. June Leap, BS, RDMS, RVT    Attempted call report with no answer.

## 2019-04-22 ENCOUNTER — Encounter: Payer: Self-pay | Admitting: Family Medicine

## 2019-04-22 ENCOUNTER — Other Ambulatory Visit: Payer: Self-pay

## 2019-04-22 ENCOUNTER — Ambulatory Visit (INDEPENDENT_AMBULATORY_CARE_PROVIDER_SITE_OTHER): Payer: BC Managed Care – PPO | Admitting: Family Medicine

## 2019-04-22 VITALS — BP 90/60 | HR 64 | Ht 69.0 in | Wt 247.4 lb

## 2019-04-22 DIAGNOSIS — Z0000 Encounter for general adult medical examination without abnormal findings: Secondary | ICD-10-CM

## 2019-04-22 DIAGNOSIS — F419 Anxiety disorder, unspecified: Secondary | ICD-10-CM

## 2019-04-22 MED ORDER — ESCITALOPRAM OXALATE 10 MG PO TABS: 10.0000 mg | ORAL_TABLET | Freq: Every day | ORAL | 0 refills | Status: AC

## 2019-04-22 NOTE — Progress Notes (Addendum)
SUBJECTIVE:  Steven Holt is a 34 y.o. male presenting for his annual checkup.  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 Patient has lost approximately 50 pounds over the past couple years.  History of GAD.  Scores 10 on gad 7, all zeros on PHQ-9 previously trialed on Zoloft without improvement despite titration x1 from 25mg  to 50 mg over 3 to 6 months.  Patient is interested in trying another medication  Health maintenance: Patient declines HIV and hepatitis C screening.  He is in a monogamous relationship with his wife for the past 14 years, he feels that he is low risk.  I am in agreement.  Current Outpatient Medications  Medication Sig Dispense Refill  . escitalopram (LEXAPRO) 10 MG tablet Take 1 tablet (10 mg total) by mouth daily. 30 tablet 0   No current facility-administered medications for this visit.   GAD 7 : Generalized Anxiety Score 03/25/2017  Nervous, Anxious, on Edge 1  Control/stop worrying 3  Worry too much - different things 3  Trouble relaxing 2  Restless 1  Easily annoyed or irritable 2  Afraid - awful might happen 0  Total GAD 7 Score 12   Allergies: Penicillins   ROS:  Feeling well. No dyspnea or chest pain on exertion. No abdominal pain, change in bowel habits, black or bloody stools. No urinary tract or prostatic symptoms. No neurological complaints.  OBJECTIVE:  The patient appears well, alert, oriented x 3, in no distress.  BP 90/60   Pulse 64   Ht 5\' 9"  (1.753 m)   Wt 247 lb 6 oz (112.2 kg)   SpO2 99%   BMI 36.53 kg/m  ENT normal.  Neck supple. No adenopathy or thyromegaly. PERLA. Lungs are clear, good air entry, no wheezes, rhonchi or rales. S1 and S2 normal, no murmurs, regular rate and rhythm. Abdomen is soft without tenderness, guarding, mass or organomegaly. GU exam: deferred.  Extremities show no edema, normal peripheral pulses. Neurological is normal without focal  findings.  ASSESSMENT:   Problem List Items Addressed This Visit      Other   Anxiety - Primary    Persistantly elevated GAD7 score. Currently only treated with exercise.   - Escitalopram 10 mg - F/u in 1 month - Repeat GAD7 at next visit      Relevant Medications   escitalopram (LEXAPRO) 10 MG tablet    Other Visit Diagnoses    Encounter for annual physical exam

## 2019-04-22 NOTE — Patient Instructions (Signed)

## 2019-04-25 ENCOUNTER — Encounter: Payer: Self-pay | Admitting: Family Medicine

## 2019-04-25 NOTE — Assessment & Plan Note (Signed)
Persistantly elevated GAD7 score. Currently only treated with exercise.   - Escitalopram 10 mg - F/u in 1 month - Repeat GAD7 at next visit

## 2020-05-21 DIAGNOSIS — M7542 Impingement syndrome of left shoulder: Secondary | ICD-10-CM | POA: Diagnosis not present

## 2020-05-21 DIAGNOSIS — M9901 Segmental and somatic dysfunction of cervical region: Secondary | ICD-10-CM | POA: Diagnosis not present

## 2020-05-21 DIAGNOSIS — M9907 Segmental and somatic dysfunction of upper extremity: Secondary | ICD-10-CM | POA: Diagnosis not present

## 2020-05-21 DIAGNOSIS — M9902 Segmental and somatic dysfunction of thoracic region: Secondary | ICD-10-CM | POA: Diagnosis not present

## 2020-05-23 DIAGNOSIS — M7542 Impingement syndrome of left shoulder: Secondary | ICD-10-CM | POA: Diagnosis not present

## 2020-05-23 DIAGNOSIS — M9907 Segmental and somatic dysfunction of upper extremity: Secondary | ICD-10-CM | POA: Diagnosis not present

## 2020-05-23 DIAGNOSIS — M9902 Segmental and somatic dysfunction of thoracic region: Secondary | ICD-10-CM | POA: Diagnosis not present

## 2020-05-23 DIAGNOSIS — M9901 Segmental and somatic dysfunction of cervical region: Secondary | ICD-10-CM | POA: Diagnosis not present

## 2020-06-07 DIAGNOSIS — M9902 Segmental and somatic dysfunction of thoracic region: Secondary | ICD-10-CM | POA: Diagnosis not present

## 2020-06-07 DIAGNOSIS — M7542 Impingement syndrome of left shoulder: Secondary | ICD-10-CM | POA: Diagnosis not present

## 2020-06-07 DIAGNOSIS — M9901 Segmental and somatic dysfunction of cervical region: Secondary | ICD-10-CM | POA: Diagnosis not present

## 2020-06-07 DIAGNOSIS — M9907 Segmental and somatic dysfunction of upper extremity: Secondary | ICD-10-CM | POA: Diagnosis not present

## 2020-06-14 DIAGNOSIS — E669 Obesity, unspecified: Secondary | ICD-10-CM | POA: Diagnosis not present

## 2020-06-14 DIAGNOSIS — E6609 Other obesity due to excess calories: Secondary | ICD-10-CM | POA: Diagnosis not present

## 2020-06-14 DIAGNOSIS — K5904 Chronic idiopathic constipation: Secondary | ICD-10-CM | POA: Diagnosis not present

## 2020-06-14 DIAGNOSIS — R748 Abnormal levels of other serum enzymes: Secondary | ICD-10-CM | POA: Diagnosis not present

## 2020-06-14 DIAGNOSIS — R140 Abdominal distension (gaseous): Secondary | ICD-10-CM | POA: Diagnosis not present

## 2020-06-18 ENCOUNTER — Other Ambulatory Visit: Payer: Self-pay | Admitting: Gastroenterology

## 2020-06-18 DIAGNOSIS — R7989 Other specified abnormal findings of blood chemistry: Secondary | ICD-10-CM

## 2020-07-09 ENCOUNTER — Ambulatory Visit
Admission: RE | Admit: 2020-07-09 | Discharge: 2020-07-09 | Disposition: A | Discharge status: Home / Self Care | Payer: BC Managed Care – PPO | Source: Ambulatory Visit | Attending: Gastroenterology | Admitting: Gastroenterology

## 2020-07-09 ENCOUNTER — Other Ambulatory Visit: Payer: Self-pay

## 2020-07-09 DIAGNOSIS — M9902 Segmental and somatic dysfunction of thoracic region: Secondary | ICD-10-CM | POA: Diagnosis not present

## 2020-07-09 DIAGNOSIS — R7989 Other specified abnormal findings of blood chemistry: Secondary | ICD-10-CM

## 2020-07-09 DIAGNOSIS — M9907 Segmental and somatic dysfunction of upper extremity: Secondary | ICD-10-CM | POA: Diagnosis not present

## 2020-07-09 DIAGNOSIS — R945 Abnormal results of liver function studies: Secondary | ICD-10-CM | POA: Diagnosis not present

## 2020-07-09 DIAGNOSIS — M7542 Impingement syndrome of left shoulder: Secondary | ICD-10-CM | POA: Diagnosis not present

## 2020-07-09 DIAGNOSIS — M9901 Segmental and somatic dysfunction of cervical region: Secondary | ICD-10-CM | POA: Diagnosis not present

## 2020-07-12 NOTE — Progress Notes (Signed)
    SUBJECTIVE:   CHIEF COMPLAINT / HPI:   Headaches:    Patient states that he was lifting weights 2 weeks ago, squatting heavy weights for higher reps, when he suddenly felt like a "grenade went off in his head" and he dropped to his knees.  This was in all over headache that was 10/10 on intensity.  It resolved by the next morning.  He then took it easy the rest of the week but when he tried to do squats again next week he had the same sensation.  This time the headache lasted from Monday afternoon until Tuesday night.  He took ibuprofen and Tylenol.  He still has lingering pain in his head on the right side behind the right eye and ear.  If he exerts himself too much the pain in his head gets worse but it has not been as bad as was deferred to time.  He went to a sports chiropractor and they did some "tweaking".  Patient was initially sensitive to light as well, but this has improved.  He has had no focal deficits such as dysarthria, one-sided weakness, dizziness.  Patient has history of migraines as a child but they have not been much of an issue for him as an adult.  Usually they resolve completely with ibuprofen.  He takes fish oil, magnesium, vitamin D, probiotic, melatonin and also sees a gastroenterologist for irritable bowel syndrome and takes Linzess.     PERTINENT  PMH / PSH: anxiety   OBJECTIVE:   BP 104/72   Pulse 61   Ht 5\' 9"  (1.753 m)   Wt 227 lb 12.8 oz (103.3 kg)   SpO2 97%   BMI 33.64 kg/m   General: Alert and oriented.  No acute distress.  Overweight male, appears stated age. HEENT: PERRLA, EOMI, moist oral mucosa.  Normal tympanic membranes. CV: Regular rate and rhythm, no murmurs Pulmonary: Lungs clear to auscultation bilaterally, no wheezes or crackles. GI: Soft, nontender to palpation. MSK: 5/5 strength upper and lower extremities bilaterally. Neuro: Cranial nerves II through XII grossly intact, sensation in the upper and lower extremities equal bilaterally and  intact.  Normal gait.  Finger-nose test normal.  ASSESSMENT/PLAN:   Primary exertional headache Patient describes a sudden onset severe bilateral headache that occurred with exertion on multiple occasions.  No focal neural deficits.  History of migraines but no other family or personal history of disorders of the brain.  Most likely exertional headache given history of events, but will need to rule out vascular cause such as aneurysm or AV malformation.  Will order MRI brain and MRA.  Advised patient to avoid exertion until these test return.     , MD [facility]

## 2020-07-13 ENCOUNTER — Ambulatory Visit (INDEPENDENT_AMBULATORY_CARE_PROVIDER_SITE_OTHER): Payer: BC Managed Care – PPO | Admitting: Family Medicine

## 2020-07-13 ENCOUNTER — Other Ambulatory Visit: Payer: Self-pay

## 2020-07-13 ENCOUNTER — Encounter: Payer: Self-pay | Admitting: Family Medicine

## 2020-07-13 DIAGNOSIS — G4484 Primary exertional headache: Secondary | ICD-10-CM

## 2020-07-13 DIAGNOSIS — G441 Vascular headache, not elsewhere classified: Secondary | ICD-10-CM

## 2020-07-13 NOTE — Patient Instructions (Signed)
It was nice to meet you today,  I have ordered an MRI of your head to make sure that this is not due to a vascular issue.  If that is ruled out then it is most likely due to a exertional headache related to weightlifting.  , In the meantime, I would avoid doing any heavy lifting that can trigger these headaches.  You can take ibuprofen and Tylenol as needed for relief.  Have a great day,  Frederic Jericho, MD

## 2020-07-14 DIAGNOSIS — G4484 Primary exertional headache: Secondary | ICD-10-CM | POA: Insufficient documentation

## 2020-07-14 NOTE — Assessment & Plan Note (Signed)
Patient describes a sudden onset severe bilateral headache that occurred with exertion on multiple occasions.  No focal neural deficits.  History of migraines but no other family or personal history of disorders of the brain.  Most likely exertional headache given history of events, but will need to rule out vascular cause such as aneurysm or AV malformation.  Will order MRI brain and MRA.  Advised patient to avoid exertion until these test return.

## 2020-07-18 DIAGNOSIS — M7542 Impingement syndrome of left shoulder: Secondary | ICD-10-CM | POA: Diagnosis not present

## 2020-07-18 DIAGNOSIS — M9902 Segmental and somatic dysfunction of thoracic region: Secondary | ICD-10-CM | POA: Diagnosis not present

## 2020-07-18 DIAGNOSIS — M9907 Segmental and somatic dysfunction of upper extremity: Secondary | ICD-10-CM | POA: Diagnosis not present

## 2020-07-18 DIAGNOSIS — M9901 Segmental and somatic dysfunction of cervical region: Secondary | ICD-10-CM | POA: Diagnosis not present

## 2020-07-23 ENCOUNTER — Ambulatory Visit (HOSPITAL_COMMUNITY)
Admission: RE | Admit: 2020-07-23 | Discharge: 2020-07-23 | Disposition: A | Discharge status: Home / Self Care | Payer: BC Managed Care – PPO | Source: Ambulatory Visit | Attending: Family Medicine | Admitting: Family Medicine

## 2020-07-23 ENCOUNTER — Other Ambulatory Visit: Payer: Self-pay

## 2020-07-23 DIAGNOSIS — G4484 Primary exertional headache: Secondary | ICD-10-CM

## 2020-07-23 DIAGNOSIS — R519 Headache, unspecified: Secondary | ICD-10-CM | POA: Diagnosis not present

## 2020-07-25 ENCOUNTER — Encounter: Payer: Self-pay | Admitting: Family Medicine

## 2020-07-25 ENCOUNTER — Other Ambulatory Visit: Payer: Self-pay | Admitting: Family Medicine

## 2020-07-25 MED ORDER — INDOMETHACIN 25 MG PO CAPS: ORAL_CAPSULE | ORAL | 1 refills | Status: AC

## 2020-11-01 ENCOUNTER — Telehealth: Payer: BC Managed Care – PPO | Admitting: Physician Assistant

## 2020-11-01 DIAGNOSIS — R197 Diarrhea, unspecified: Secondary | ICD-10-CM | POA: Diagnosis not present

## 2020-11-01 MED ORDER — ONDANSETRON HCL 4 MG PO TABS: 4.0000 mg | ORAL_TABLET | Freq: Three times a day (TID) | ORAL | 0 refills | Status: AC | PRN

## 2020-11-01 NOTE — Progress Notes (Signed)
We are sorry that you are not feeling well.  Here is how we plan to help!  Based on what you have shared with me it looks like you have Acute Infectious Diarrhea.  Most cases of acute diarrhea are due to infections with virus and bacteria and are self-limited conditions lasting less than 14 days.  For your symptoms you may take Imodium 2 mg tablets that are over the counter at your local pharmacy. Take two tablet now and then one after each loose stool up to 6 a day.  Antibiotics are not needed for most people with diarrhea.  Optional: Zofran 4 mg 1 tablet every 8 hours as needed for nausea and vomiting   HOME CARE We recommend changing your diet to help with your symptoms for the next few days. Drink plenty of fluids that contain water salt and sugar. Sports drinks such as Gatorade may help.  You may try broths, soups, bananas, applesauce, soft breads, mashed potatoes or crackers.  You are considered infectious for as long as the diarrhea continues. Hand washing or use of alcohol based hand sanitizers is recommend. It is best to stay out of work or school until your symptoms stop.   GET HELP RIGHT AWAY If you have dark yellow colored urine or do not pass urine frequently you should drink more fluids.   If your symptoms worsen  If you feel like you are going to pass out (faint) You have a new problem  MAKE SURE YOU  Understand these instructions. Will watch your condition. Will get help right away if you are not doing well or get worse.  Thank you for choosing an e-visit.  Your e-visit answers were reviewed by a board certified advanced clinical practitioner to complete your personal care plan. Depending upon the condition, your plan could have included both over the counter or prescription medications.  Please review your pharmacy choice. Make sure the pharmacy is open so you can pick up prescription now. If there is a problem, you may contact your provider through [employer] and have the prescription routed to another pharmacy.  Your safety is important to [place]. If you have drug allergies check your prescription carefully.   For the next 24 hours you can use MyChart to ask questions about today's visit, request a non-urgent call back, or ask for a work or school excuse. You will get an email in the next two days asking about your experience. I hope that your e-visit has been valuable and will speed your recovery.  I provided 6 minutes of non face-to-face time during this encounter for chart review and documentation.

## 2020-12-13 DIAGNOSIS — M9901 Segmental and somatic dysfunction of cervical region: Secondary | ICD-10-CM | POA: Diagnosis not present

## 2020-12-13 DIAGNOSIS — M9902 Segmental and somatic dysfunction of thoracic region: Secondary | ICD-10-CM | POA: Diagnosis not present

## 2020-12-13 DIAGNOSIS — M9907 Segmental and somatic dysfunction of upper extremity: Secondary | ICD-10-CM | POA: Diagnosis not present

## 2020-12-13 DIAGNOSIS — M7542 Impingement syndrome of left shoulder: Secondary | ICD-10-CM | POA: Diagnosis not present

## 2021-01-23 DIAGNOSIS — M9901 Segmental and somatic dysfunction of cervical region: Secondary | ICD-10-CM | POA: Diagnosis not present

## 2021-01-23 DIAGNOSIS — M9902 Segmental and somatic dysfunction of thoracic region: Secondary | ICD-10-CM | POA: Diagnosis not present

## 2021-01-23 DIAGNOSIS — M9907 Segmental and somatic dysfunction of upper extremity: Secondary | ICD-10-CM | POA: Diagnosis not present

## 2021-01-23 DIAGNOSIS — M7542 Impingement syndrome of left shoulder: Secondary | ICD-10-CM | POA: Diagnosis not present

## 2021-01-24 DIAGNOSIS — M9901 Segmental and somatic dysfunction of cervical region: Secondary | ICD-10-CM | POA: Diagnosis not present

## 2021-01-24 DIAGNOSIS — M9902 Segmental and somatic dysfunction of thoracic region: Secondary | ICD-10-CM | POA: Diagnosis not present

## 2021-01-24 DIAGNOSIS — M9907 Segmental and somatic dysfunction of upper extremity: Secondary | ICD-10-CM | POA: Diagnosis not present

## 2021-01-24 DIAGNOSIS — M7542 Impingement syndrome of left shoulder: Secondary | ICD-10-CM | POA: Diagnosis not present

## 2021-02-21 DIAGNOSIS — M9902 Segmental and somatic dysfunction of thoracic region: Secondary | ICD-10-CM | POA: Diagnosis not present

## 2021-02-21 DIAGNOSIS — M9907 Segmental and somatic dysfunction of upper extremity: Secondary | ICD-10-CM | POA: Diagnosis not present

## 2021-02-21 DIAGNOSIS — M7542 Impingement syndrome of left shoulder: Secondary | ICD-10-CM | POA: Diagnosis not present

## 2021-02-21 DIAGNOSIS — M9901 Segmental and somatic dysfunction of cervical region: Secondary | ICD-10-CM | POA: Diagnosis not present

## 2021-02-25 DIAGNOSIS — M7542 Impingement syndrome of left shoulder: Secondary | ICD-10-CM | POA: Diagnosis not present

## 2021-02-25 DIAGNOSIS — M9907 Segmental and somatic dysfunction of upper extremity: Secondary | ICD-10-CM | POA: Diagnosis not present

## 2021-02-25 DIAGNOSIS — M9901 Segmental and somatic dysfunction of cervical region: Secondary | ICD-10-CM | POA: Diagnosis not present

## 2021-02-25 DIAGNOSIS — M9902 Segmental and somatic dysfunction of thoracic region: Secondary | ICD-10-CM | POA: Diagnosis not present

## 2021-02-28 DIAGNOSIS — M9907 Segmental and somatic dysfunction of upper extremity: Secondary | ICD-10-CM | POA: Diagnosis not present

## 2021-02-28 DIAGNOSIS — M7542 Impingement syndrome of left shoulder: Secondary | ICD-10-CM | POA: Diagnosis not present

## 2021-02-28 DIAGNOSIS — M9902 Segmental and somatic dysfunction of thoracic region: Secondary | ICD-10-CM | POA: Diagnosis not present

## 2021-02-28 DIAGNOSIS — M9901 Segmental and somatic dysfunction of cervical region: Secondary | ICD-10-CM | POA: Diagnosis not present

## 2021-05-01 DIAGNOSIS — Z0000 Encounter for general adult medical examination without abnormal findings: Secondary | ICD-10-CM | POA: Diagnosis not present

## 2021-05-01 DIAGNOSIS — K581 Irritable bowel syndrome with constipation: Secondary | ICD-10-CM | POA: Diagnosis not present

## 2021-05-01 DIAGNOSIS — Z6835 Body mass index (BMI) 35.0-35.9, adult: Secondary | ICD-10-CM | POA: Diagnosis not present

## 2021-05-20 DIAGNOSIS — M7542 Impingement syndrome of left shoulder: Secondary | ICD-10-CM | POA: Diagnosis not present

## 2021-05-20 DIAGNOSIS — M9902 Segmental and somatic dysfunction of thoracic region: Secondary | ICD-10-CM | POA: Diagnosis not present

## 2021-05-20 DIAGNOSIS — M9907 Segmental and somatic dysfunction of upper extremity: Secondary | ICD-10-CM | POA: Diagnosis not present

## 2021-05-20 DIAGNOSIS — M9901 Segmental and somatic dysfunction of cervical region: Secondary | ICD-10-CM | POA: Diagnosis not present

## 2021-05-28 IMAGING — MR MR MRA HEAD W/O CM
1 series · 20 of 48 positions shown · non-contrast
Comparison: No pertinent prior exam.
COMPARISON: No pertinent prior exam.

CLINICAL DATA: Primary exertional headache. Headache, intracranial
hemorrhage suspected. Additional history provided by scanning
technologist: Patient reports sudden onset severe bilateral headache
occurring with exertion on multiple occasions. History of migraines.

EXAM:
MRI HEAD WITHOUT CONTRAST
MRA HEAD WITHOUT CONTRAST
TECHNIQUE: Multiplanar, multi-echo pulse sequences of the brain and surrounding
structures were acquired without intravenous contrast. Angiographic
images of the Circle of Willis were acquired using MRA technique
without intravenous contrast.

[Series 6: TOF · axial · 0.6mm · 0.39mm/px · z∈[-68,+10]mm · 20 of 139 slices shown]
[im 1/139]
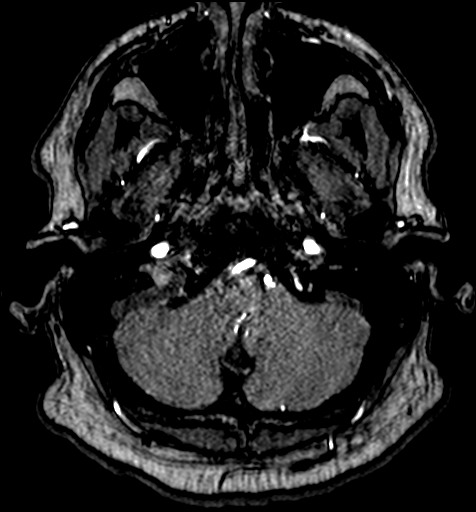
[im 3/139]
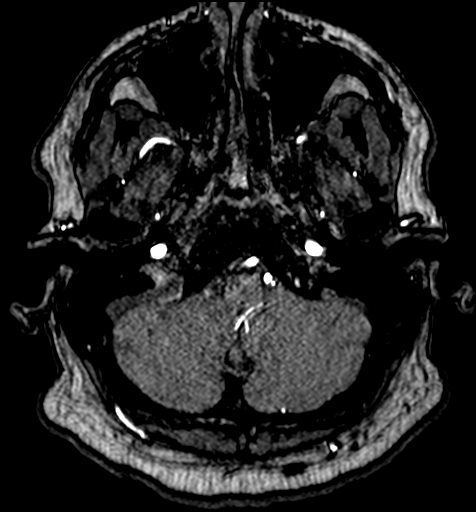
[im 6/139]
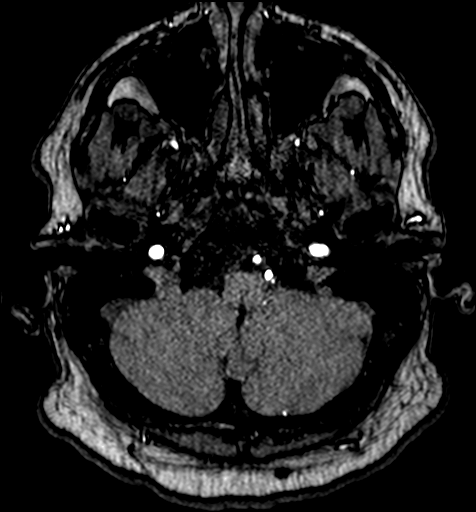
[im 9/139]
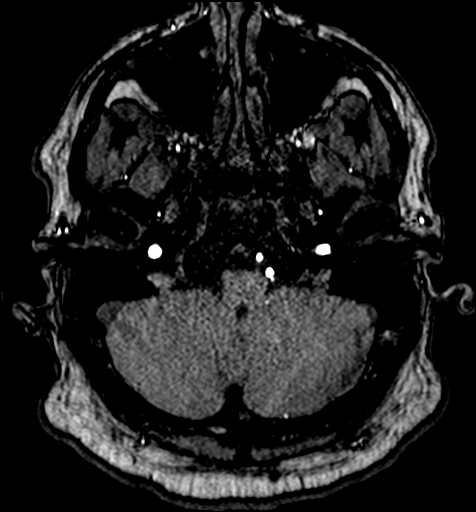
[im 12/139]
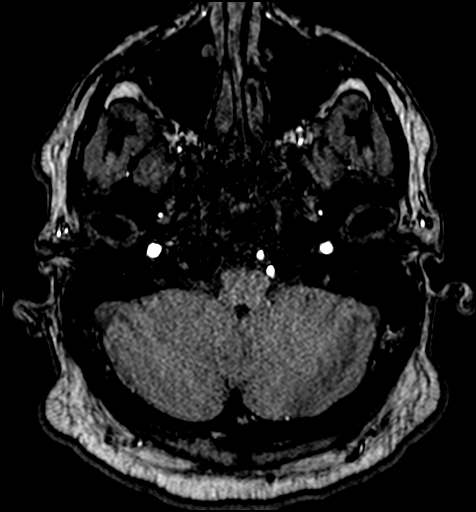
[im 15/139]
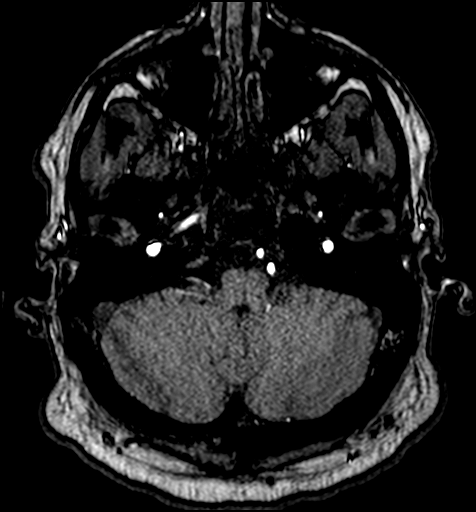
[im 18/139]
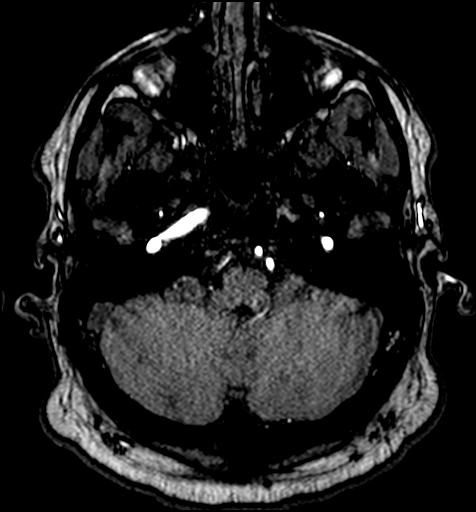
[im 21/139]
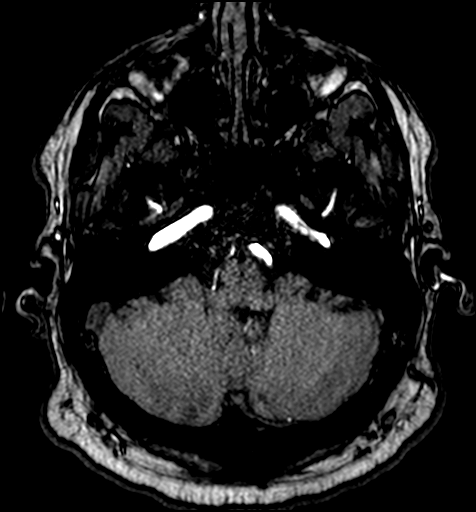
[im 24/139]
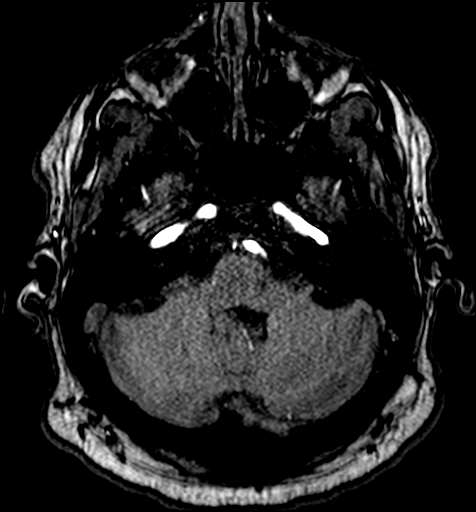
[im 27/139]
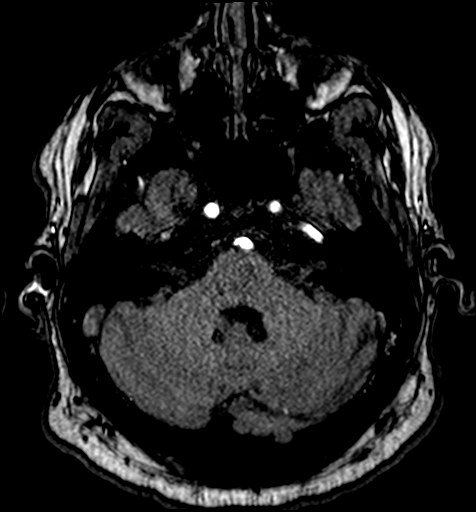
[im 30/139]
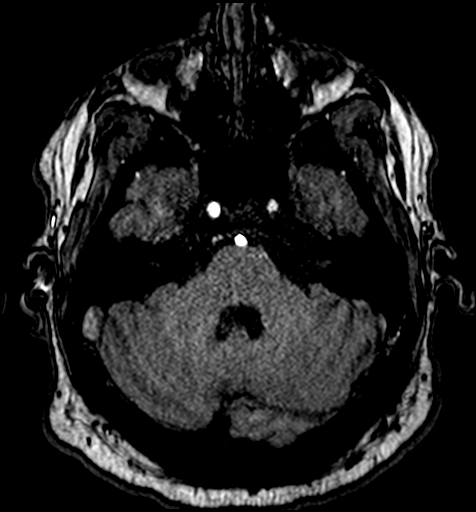
[im 33/139]
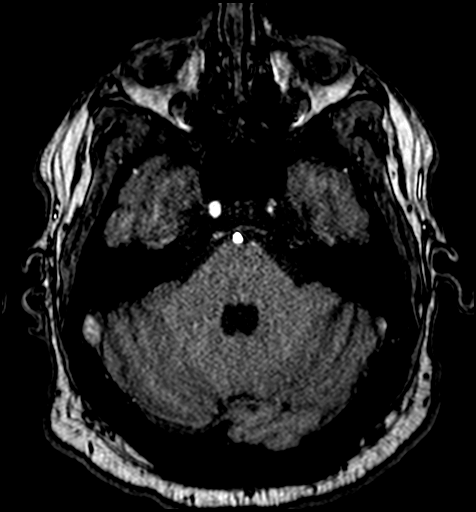
[im 45/139]
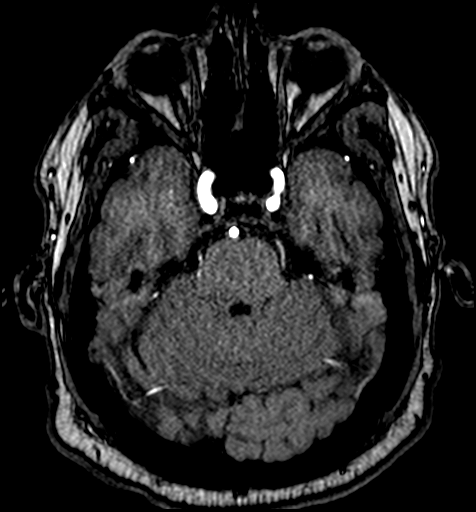
[im 62/139]
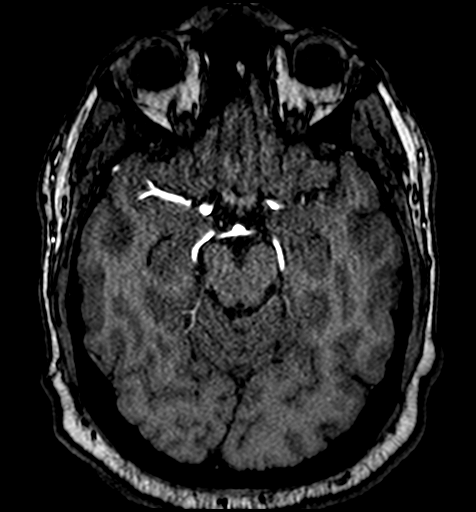
[im 71/139]
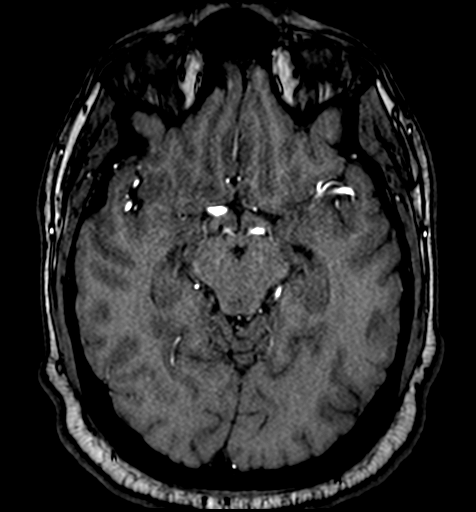
[im 80/139]
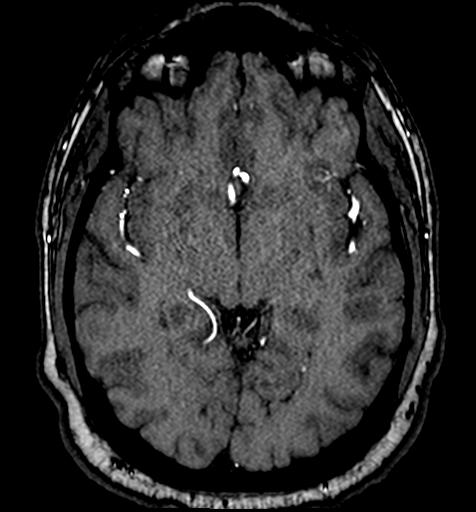
[im 97/139]
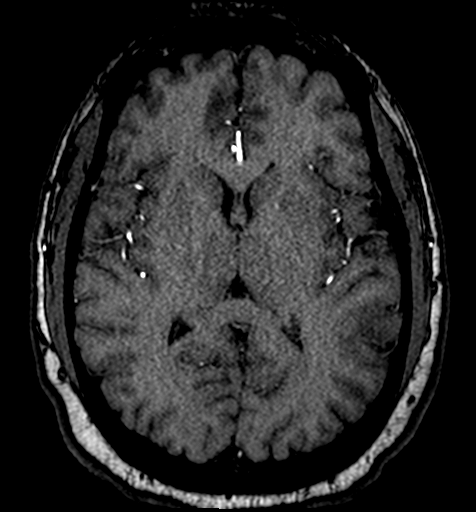
[im 115/139]
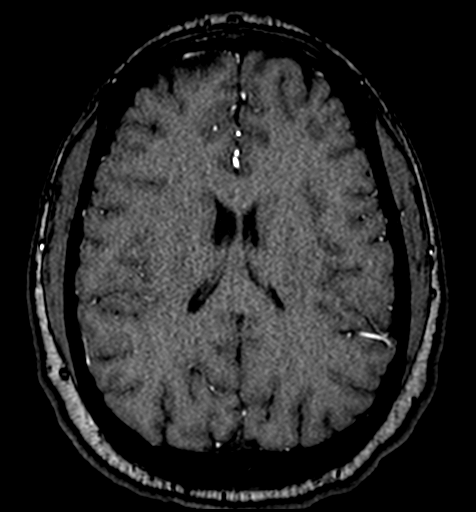
[im 118/139]
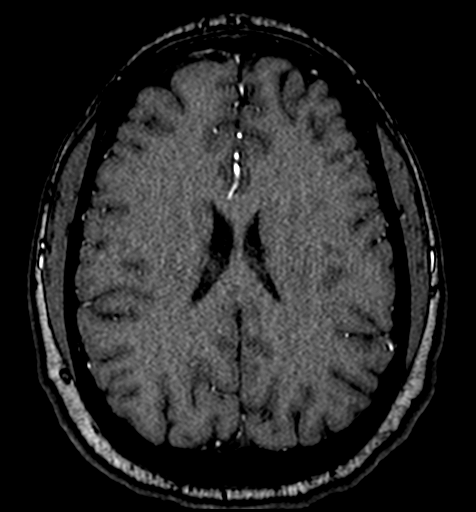
[im 133/139]
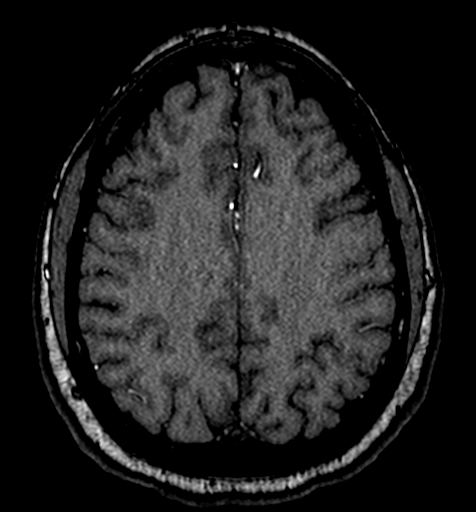

[20 of 48 positions shown; findings below may reference images not displayed]

FINDINGS: MRI HEAD FINDINGS

Brain:

Cerebral volume is normal.

No cortical encephalomalacia is identified.

No significant white matter disease.

There is no acute infarct.

No evidence of intracranial mass.

No chronic intracranial blood products.

No extra-axial fluid collection.

No midline shift.

Vascular: Expected proximal arterial flow voids.

Skull and upper cervical spine: No focal marrow lesion.

Sinuses/Orbits: Visualized orbits show no acute finding. Trace
bilateral ethmoid sinus mucosal thickening.

Other: Incidentally noted 9 mm T2/FLAIR and T1 hyperintense focus
within the nasopharynx, which may reflect a mucous
retention/Tornwaldt cyst (series 12, image 20) (series 11, image 5).

MRA HEAD FINDINGS

Anterior circulation:

The intracranial internal carotid arteries are patent. The M1 middle
cerebral arteries are patent. No M2 proximal branch occlusion or
high-grade proximal stenosis is identified. The anterior cerebral
arteries are patent. Hypoplastic A1 left ACA. 1 mm inferiorly
projecting vascular protrusion arising from the supraclinoid left
ICA, which may reflect a tiny aneurysm or infundibulum (series 102,
image 8).

Posterior circulation:

The visualized intracranial vertebral arteries are patent. The
basilar artery is patent. The posterior cerebral arteries are
patent. A small right posterior communicating artery is present. The
left posterior communicating artery is hypoplastic or absent.

Anatomic variants: As described.
IMPRESSION: MRI brain:

Unremarkable non-contrast MRI appearance of the brain. No evidence
of acute intracranial abnormality.

MRA head:

1. No intracranial large vessel occlusion or proximal high-grade
arterial stenosis.
2. 1 mm inferiorly projecting vascular protrusion arising from the
supraclinoid left ICA, which may reflect a tiny aneurysm or
infundibulum.

## 2021-05-28 IMAGING — MR MR HEAD W/O CM
11 series · 48 of 48 positions shown · non-contrast
Comparison: No pertinent prior exam.
COMPARISON: No pertinent prior exam.

CLINICAL DATA: Primary exertional headache. Headache, intracranial
hemorrhage suspected. Additional history provided by scanning
technologist: Patient reports sudden onset severe bilateral headache
occurring with exertion on multiple occasions. History of migraines.

EXAM:
MRI HEAD WITHOUT CONTRAST
MRA HEAD WITHOUT CONTRAST
TECHNIQUE: Multiplanar, multi-echo pulse sequences of the brain and surrounding
structures were acquired without intravenous contrast. Angiographic
images of the Circle of Willis were acquired using MRA technique
without intravenous contrast.

[Series 5: DWI · axial · 3.0mm · 1.36mm/px · z∈[-86,+55]mm · 8 of 96 slices shown (1 of 2)]
[im 1/96]
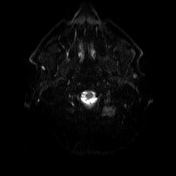
[im 14/96]
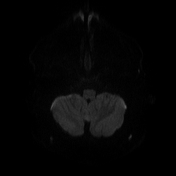
[im 28/96]
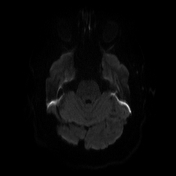
[im 41/96]
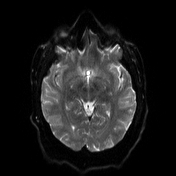
[im 55/96]
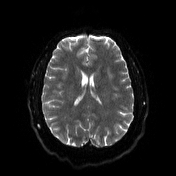
[im 68/96]
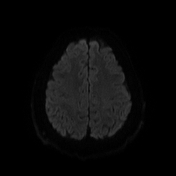
[im 82/96]
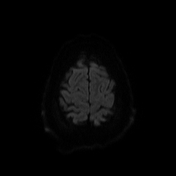
[im 96/96]
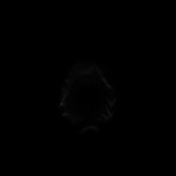

[Series 6: DWI · axial · 3.0mm · 1.36mm/px · z∈[-86,+55]mm · 4 of 48 slices shown (2 of 2)]
[im 1/48]
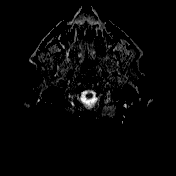
[im 16/48]
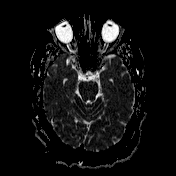
[im 32/48]
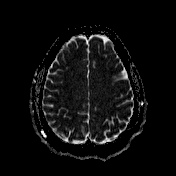
[im 48/48]
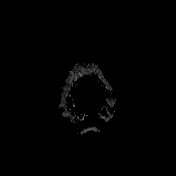

[Series 7: T1 · sagittal · 5.0mm · 0.75mm/px · 2 of 24 slices shown (1 of 2)]
[im 1/24]
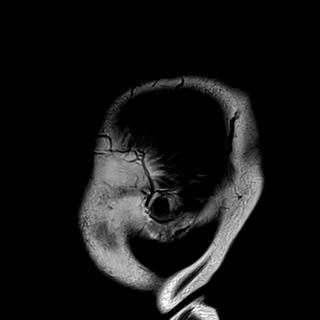
[im 24/24]
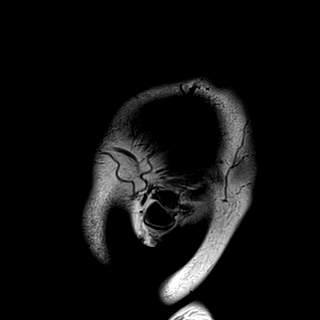

[Series 8: T2 · axial · 5.0mm · 0.62mm/px · z∈[-96,+66]mm · 2 of 26 slices shown (1 of 2)]
[im 1/26]
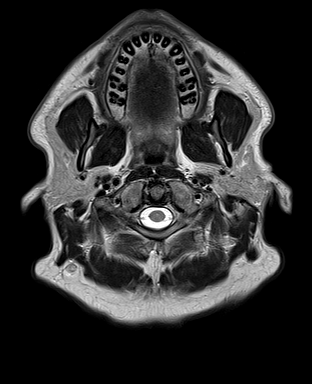
[im 26/26]
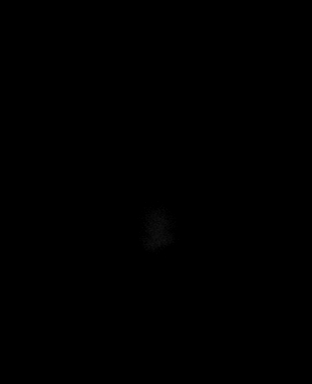

[Series 9: mip_images(sw) · axial · 24.0mm · 0.75mm/px · z∈[-93,+63]mm · 4 of 53 slices shown]
[im 1/53]
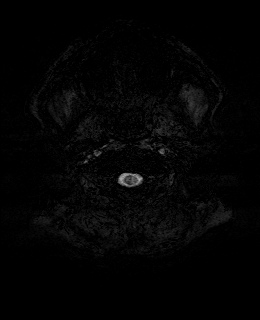
[im 18/53]
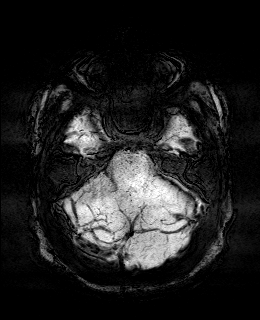
[im 35/53]
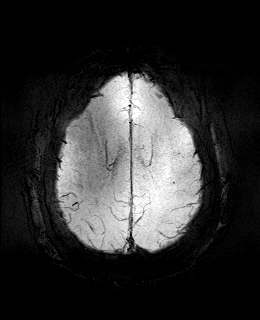
[im 53/53]
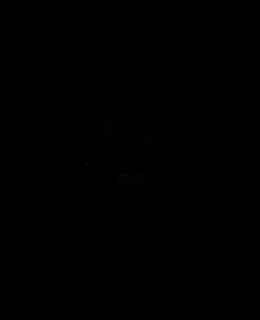

[Series 10: swi_images · axial · 3.0mm · 0.75mm/px · z∈[-103,+73]mm · 5 of 60 slices shown]
[im 1/60]
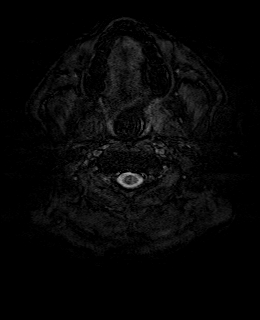
[im 15/60]
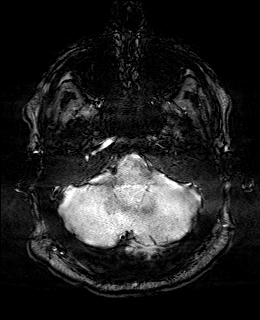
[im 30/60]
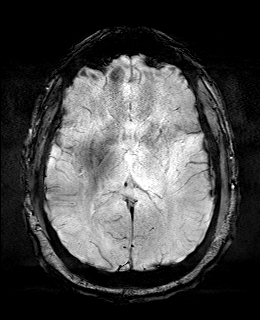
[im 45/60]
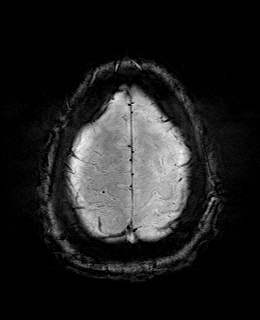
[im 60/60]
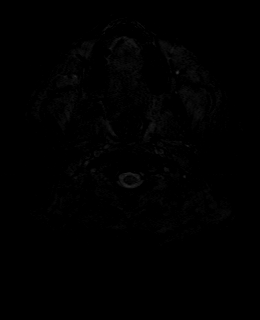

[Series 11: FLAIR · axial · 3.0mm · 0.75mm/px · z∈[-91,+58]mm · 2 of 26 slices shown]
[im 1/26]
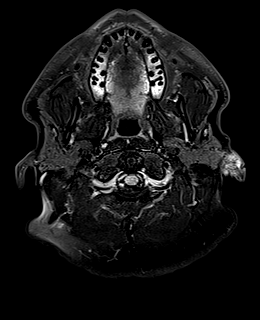
[im 26/26]
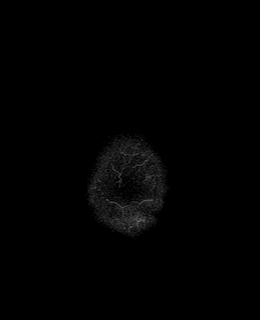

[Series 12: T1 · axial · 1.0mm · 0.94mm/px · z∈[-86,+56]mm · 12 of 144 slices shown (2 of 2)]
[im 1/144]
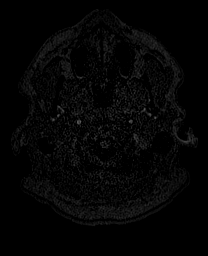
[im 14/144]
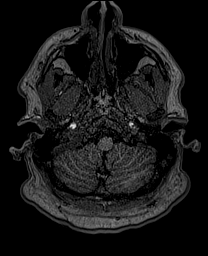
[im 27/144]
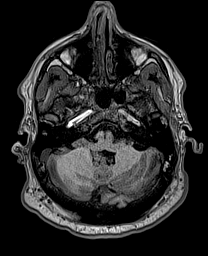
[im 40/144]
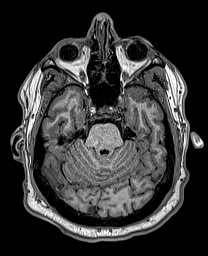
[im 53/144]
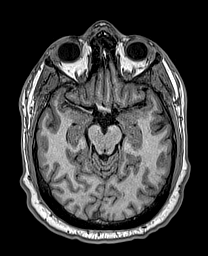
[im 66/144]
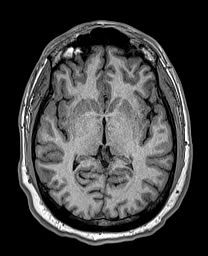
[im 79/144]
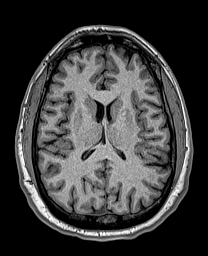
[im 92/144]
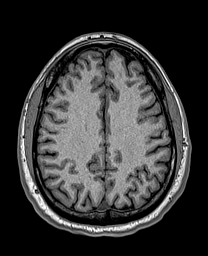
[im 105/144]
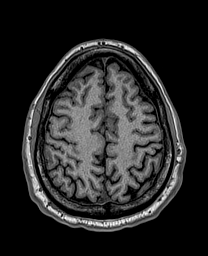
[im 118/144]
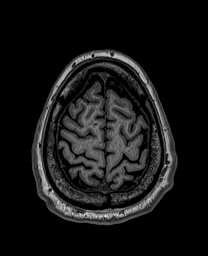
[im 131/144]
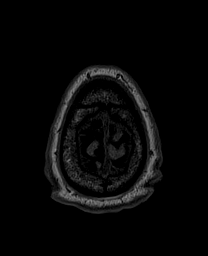
[im 144/144]
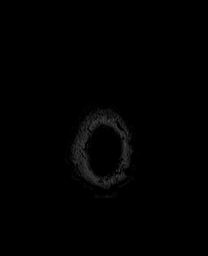

[Series 13: cor dwi_tracew · coronal · 5.0mm · 1.53mm/px · 4 of 50 slices shown]
[im 1/50]
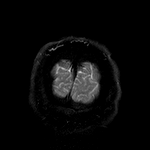
[im 17/50]
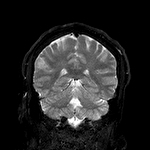
[im 33/50]
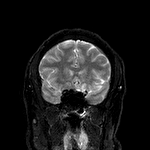
[im 50/50]
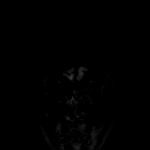

[Series 14: cor dwi_adc · coronal · 5.0mm · 1.53mm/px · 2 of 25 slices shown]
[im 1/25]
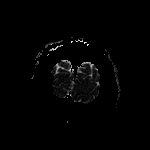
[im 25/25]
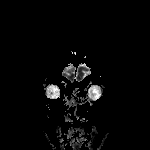

[Series 15: T2 · coronal · 5.0mm · 0.57mm/px · 3 of 32 slices shown (2 of 2)]
[im 1/32]
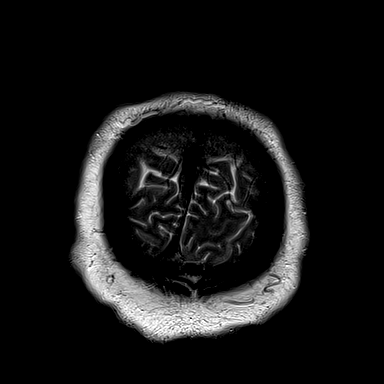
[im 16/32]
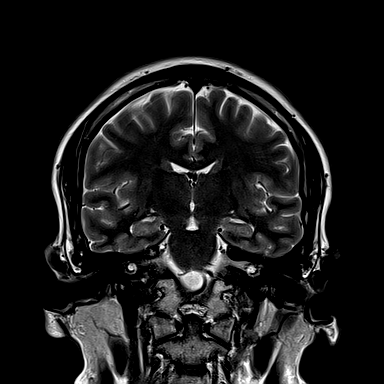
[im 32/32]
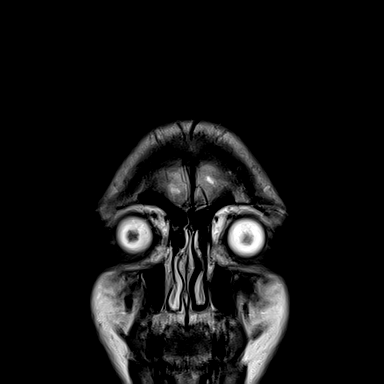

[48 of 48 positions shown; findings below may reference images not displayed]

FINDINGS: MRI HEAD FINDINGS

Brain:

Cerebral volume is normal.

No cortical encephalomalacia is identified.

No significant white matter disease.

There is no acute infarct.

No evidence of intracranial mass.

No chronic intracranial blood products.

No extra-axial fluid collection.

No midline shift.

Vascular: Expected proximal arterial flow voids.

Skull and upper cervical spine: No focal marrow lesion.

Sinuses/Orbits: Visualized orbits show no acute finding. Trace
bilateral ethmoid sinus mucosal thickening.

Other: Incidentally noted 9 mm T2/FLAIR and T1 hyperintense focus
within the nasopharynx, which may reflect a mucous
retention/Tornwaldt cyst (series 12, image 20) (series 11, image 5).

MRA HEAD FINDINGS

Anterior circulation:

The intracranial internal carotid arteries are patent. The M1 middle
cerebral arteries are patent. No M2 proximal branch occlusion or
high-grade proximal stenosis is identified. The anterior cerebral
arteries are patent. Hypoplastic A1 left ACA. 1 mm inferiorly
projecting vascular protrusion arising from the supraclinoid left
ICA, which may reflect a tiny aneurysm or infundibulum (series 102,
image 8).

Posterior circulation:

The visualized intracranial vertebral arteries are patent. The
basilar artery is patent. The posterior cerebral arteries are
patent. A small right posterior communicating artery is present. The
left posterior communicating artery is hypoplastic or absent.

Anatomic variants: As described.
IMPRESSION: MRI brain:

Unremarkable non-contrast MRI appearance of the brain. No evidence
of acute intracranial abnormality.

MRA head:

1. No intracranial large vessel occlusion or proximal high-grade
arterial stenosis.
2. 1 mm inferiorly projecting vascular protrusion arising from the
supraclinoid left ICA, which may reflect a tiny aneurysm or
infundibulum.

## 2021-06-17 DIAGNOSIS — M9907 Segmental and somatic dysfunction of upper extremity: Secondary | ICD-10-CM | POA: Diagnosis not present

## 2021-06-17 DIAGNOSIS — M9901 Segmental and somatic dysfunction of cervical region: Secondary | ICD-10-CM | POA: Diagnosis not present

## 2021-06-17 DIAGNOSIS — M7542 Impingement syndrome of left shoulder: Secondary | ICD-10-CM | POA: Diagnosis not present

## 2021-06-17 DIAGNOSIS — M9902 Segmental and somatic dysfunction of thoracic region: Secondary | ICD-10-CM | POA: Diagnosis not present

## 2021-08-20 ENCOUNTER — Encounter: Payer: Self-pay | Admitting: *Deleted

## 2021-08-26 DIAGNOSIS — M9901 Segmental and somatic dysfunction of cervical region: Secondary | ICD-10-CM | POA: Diagnosis not present

## 2021-08-26 DIAGNOSIS — M9907 Segmental and somatic dysfunction of upper extremity: Secondary | ICD-10-CM | POA: Diagnosis not present

## 2021-08-26 DIAGNOSIS — M9902 Segmental and somatic dysfunction of thoracic region: Secondary | ICD-10-CM | POA: Diagnosis not present

## 2021-08-26 DIAGNOSIS — M7542 Impingement syndrome of left shoulder: Secondary | ICD-10-CM | POA: Diagnosis not present

## 2021-09-02 DIAGNOSIS — K589 Irritable bowel syndrome without diarrhea: Secondary | ICD-10-CM | POA: Diagnosis not present

## 2021-09-02 DIAGNOSIS — K5900 Constipation, unspecified: Secondary | ICD-10-CM | POA: Diagnosis not present

## 2021-09-02 DIAGNOSIS — N200 Calculus of kidney: Secondary | ICD-10-CM | POA: Diagnosis not present

## 2021-09-09 DIAGNOSIS — N200 Calculus of kidney: Secondary | ICD-10-CM | POA: Diagnosis not present
